# Patient Record
Sex: Male | Born: 1960 | Race: White | Hispanic: Yes | Marital: Married | Smoking: Never smoker
Health system: Southern US, Community
[De-identification: ages and names within clinical notes are randomized; demographics above are authoritative.]

## PROBLEM LIST (undated history)

## (undated) DIAGNOSIS — F319 Bipolar disorder, unspecified: Secondary | ICD-10-CM

## (undated) DIAGNOSIS — F909 Attention-deficit hyperactivity disorder, unspecified type: Secondary | ICD-10-CM

## (undated) DIAGNOSIS — L409 Psoriasis, unspecified: Secondary | ICD-10-CM

## (undated) DIAGNOSIS — E785 Hyperlipidemia, unspecified: Secondary | ICD-10-CM

## (undated) HISTORY — DX: Psoriasis, unspecified: L40.9

## (undated) HISTORY — DX: Hyperlipidemia, unspecified: E78.5

## (undated) HISTORY — PX: OTHER SURGICAL HISTORY: SHX169

## (undated) HISTORY — DX: Attention-deficit hyperactivity disorder, unspecified type: F90.9

## (undated) HISTORY — DX: Bipolar disorder, unspecified: F31.9

---

## 1998-12-01 ENCOUNTER — Inpatient Hospital Stay (HOSPITAL_COMMUNITY): Admission: EM | Admit: 1998-12-01 | Discharge: 1998-12-04 | Payer: Self-pay | Admitting: Emergency Medicine

## 2002-04-26 ENCOUNTER — Other Ambulatory Visit (HOSPITAL_COMMUNITY): Admission: RE | Admit: 2002-04-26 | Discharge: 2002-04-30 | Payer: Self-pay | Admitting: *Deleted

## 2003-11-24 ENCOUNTER — Emergency Department (HOSPITAL_COMMUNITY): Admission: EM | Admit: 2003-11-24 | Discharge: 2003-11-24 | Payer: Self-pay | Admitting: Emergency Medicine

## 2006-05-26 ENCOUNTER — Ambulatory Visit: Payer: Self-pay | Admitting: Family Medicine

## 2007-01-15 ENCOUNTER — Ambulatory Visit: Payer: Self-pay | Admitting: Family Medicine

## 2007-01-15 LAB — CONVERTED CEMR LAB
ALT: 30 units/L (ref 0–40)
AST: 30 units/L (ref 0–37)
Albumin: 4.1 g/dL (ref 3.5–5.2)
Alkaline Phosphatase: 34 units/L — ABNORMAL LOW (ref 39–117)
BUN: 16 mg/dL (ref 6–23)
Calcium: 9.4 mg/dL (ref 8.4–10.5)
Chloride: 100 meq/L (ref 96–112)
Creatinine, Ser: 1 mg/dL (ref 0.4–1.5)
GFR calc non Af Amer: 87 mL/min
Sodium: 136 meq/L (ref 135–145)
Total Bilirubin: 0.6 mg/dL (ref 0.3–1.2)
Total CHOL/HDL Ratio: 6.1
VLDL: 28 mg/dL (ref 0–40)
Valproic Acid Lvl: 61.4 ug/mL (ref 50.0–100.0)

## 2007-01-30 ENCOUNTER — Encounter: Admission: RE | Admit: 2007-01-30 | Discharge: 2007-01-30 | Payer: Self-pay | Admitting: Family Medicine

## 2007-07-24 ENCOUNTER — Ambulatory Visit: Payer: Self-pay | Admitting: Family Medicine

## 2007-07-24 DIAGNOSIS — L408 Other psoriasis: Secondary | ICD-10-CM

## 2007-07-24 DIAGNOSIS — F3162 Bipolar disorder, current episode mixed, moderate: Secondary | ICD-10-CM | POA: Insufficient documentation

## 2007-07-24 DIAGNOSIS — F909 Attention-deficit hyperactivity disorder, unspecified type: Secondary | ICD-10-CM | POA: Insufficient documentation

## 2007-07-24 DIAGNOSIS — E785 Hyperlipidemia, unspecified: Secondary | ICD-10-CM

## 2007-07-30 ENCOUNTER — Encounter (INDEPENDENT_AMBULATORY_CARE_PROVIDER_SITE_OTHER): Payer: Self-pay | Admitting: Family Medicine

## 2007-07-30 LAB — CONVERTED CEMR LAB
Cholesterol: 198 mg/dL (ref 0–200)
HDL: 31.6 mg/dL — ABNORMAL LOW (ref >39.0)
Triglycerides: 208 mg/dL (ref 0–149)
VLDL: 42 mg/dL — ABNORMAL HIGH (ref 0–40)

## 2007-10-22 ENCOUNTER — Telehealth (INDEPENDENT_AMBULATORY_CARE_PROVIDER_SITE_OTHER): Payer: Self-pay | Admitting: *Deleted

## 2007-10-23 ENCOUNTER — Ambulatory Visit: Payer: Self-pay | Admitting: Family Medicine

## 2007-10-23 ENCOUNTER — Encounter (INDEPENDENT_AMBULATORY_CARE_PROVIDER_SITE_OTHER): Payer: Self-pay | Admitting: *Deleted

## 2007-10-23 ENCOUNTER — Telehealth (INDEPENDENT_AMBULATORY_CARE_PROVIDER_SITE_OTHER): Payer: Self-pay | Admitting: Family Medicine

## 2008-04-19 ENCOUNTER — Ambulatory Visit: Payer: Self-pay | Admitting: Internal Medicine

## 2008-05-10 ENCOUNTER — Encounter (INDEPENDENT_AMBULATORY_CARE_PROVIDER_SITE_OTHER): Payer: Self-pay | Admitting: *Deleted

## 2008-05-10 ENCOUNTER — Telehealth (INDEPENDENT_AMBULATORY_CARE_PROVIDER_SITE_OTHER): Payer: Self-pay | Admitting: *Deleted

## 2008-06-08 ENCOUNTER — Ambulatory Visit: Payer: Self-pay | Admitting: *Deleted

## 2008-06-08 LAB — CONVERTED CEMR LAB
ALT: 35 units/L (ref 0–53)
Albumin: 4.1 g/dL (ref 3.5–5.2)
Basophils Absolute: 0 10*3/uL (ref 0.0–0.1)
Basophils Relative: 0 % (ref 0.0–3.0)
CO2: 30 meq/L (ref 19–32)
Calcium: 9.1 mg/dL (ref 8.4–10.5)
Chloride: 106 meq/L (ref 96–112)
Cholesterol: 190 mg/dL (ref 0–200)
GFR calc Af Amer: 104 mL/min
LDL Cholesterol: 123 mg/dL — ABNORMAL HIGH (ref 0–99)
Lymphocytes Relative: 37.1 % (ref 12.0–46.0)
MCHC: 34.6 g/dL (ref 30.0–36.0)
Monocytes Relative: 7.4 % (ref 3.0–12.0)
Neutrophils Relative %: 54.2 % (ref 43.0–77.0)
RBC: 5.47 M/uL (ref 4.22–5.81)
RDW: 11.9 % (ref 11.5–14.6)
Sodium: 142 meq/L (ref 135–145)
Total CHOL/HDL Ratio: 6.7
Total Protein: 6.9 g/dL (ref 6.0–8.3)
VLDL: 38 mg/dL (ref 0–40)

## 2009-01-16 ENCOUNTER — Ambulatory Visit: Payer: Self-pay | Admitting: *Deleted

## 2009-02-24 ENCOUNTER — Ambulatory Visit: Payer: Self-pay | Admitting: *Deleted

## 2009-02-24 ENCOUNTER — Encounter: Payer: Self-pay | Admitting: Internal Medicine

## 2009-02-24 DIAGNOSIS — B889 Infestation, unspecified: Secondary | ICD-10-CM | POA: Insufficient documentation

## 2009-02-24 LAB — CONVERTED CEMR LAB
ALT: 24 units/L (ref 0–53)
AST: 26 units/L (ref 0–37)
Albumin: 4.1 g/dL (ref 3.5–5.2)
Basophils Absolute: 0 10*3/uL (ref 0.0–0.1)
Calcium: 9.3 mg/dL (ref 8.4–10.5)
Chloride: 104 meq/L (ref 96–112)
Creatinine, Ser: 1 mg/dL (ref 0.4–1.5)
Eosinophils Absolute: 0.1 10*3/uL (ref 0.0–0.7)
Hemoglobin: 16.7 g/dL (ref 13.0–17.0)
LDL Cholesterol: 136 mg/dL — ABNORMAL HIGH (ref 0–99)
Lymphocytes Relative: 37.6 % (ref 12.0–46.0)
MCHC: 35.2 g/dL (ref 30.0–36.0)
Neutro Abs: 3.3 10*3/uL (ref 1.4–7.7)
Potassium: 4.2 meq/L (ref 3.5–5.1)
RDW: 11.9 % (ref 11.5–14.6)
Sodium: 141 meq/L (ref 135–145)
Total CHOL/HDL Ratio: 6
Total Protein: 7.1 g/dL (ref 6.0–8.3)

## 2009-02-27 ENCOUNTER — Encounter: Payer: Self-pay | Admitting: Internal Medicine

## 2009-02-28 ENCOUNTER — Encounter: Payer: Self-pay | Admitting: Internal Medicine

## 2009-03-02 ENCOUNTER — Telehealth: Payer: Self-pay | Admitting: Internal Medicine

## 2009-03-03 ENCOUNTER — Telehealth (INDEPENDENT_AMBULATORY_CARE_PROVIDER_SITE_OTHER): Payer: Self-pay | Admitting: *Deleted

## 2009-08-04 ENCOUNTER — Encounter: Payer: Self-pay | Admitting: Internal Medicine

## 2009-08-29 ENCOUNTER — Ambulatory Visit: Payer: Self-pay | Admitting: Family

## 2009-08-30 ENCOUNTER — Encounter: Payer: Self-pay | Admitting: Family

## 2009-08-30 LAB — CONVERTED CEMR LAB
AST: 24 units/L (ref 0–37)
LDL Cholesterol: 155 mg/dL — ABNORMAL HIGH (ref 0–99)
Triglycerides: 166 mg/dL — ABNORMAL HIGH (ref <150)

## 2009-11-16 ENCOUNTER — Ambulatory Visit: Payer: Self-pay | Admitting: Family Medicine

## 2009-11-16 ENCOUNTER — Telehealth: Payer: Self-pay | Admitting: Family Medicine

## 2010-02-01 ENCOUNTER — Ambulatory Visit: Payer: Self-pay | Admitting: Family Medicine

## 2010-02-02 ENCOUNTER — Encounter: Payer: Self-pay | Admitting: Family Medicine

## 2010-02-02 LAB — CONVERTED CEMR LAB
ALT: 35 units/L (ref 0–53)
AST: 31 units/L (ref 0–37)

## 2010-07-19 ENCOUNTER — Ambulatory Visit: Payer: Self-pay | Admitting: Family Medicine

## 2010-07-20 ENCOUNTER — Encounter: Payer: Self-pay | Admitting: Family Medicine

## 2010-09-14 LAB — CONVERTED CEMR LAB
ALT: 20 units/L (ref 0–53)
AST: 20 units/L (ref 0–37)
Albumin: 4.1 g/dL (ref 3.5–5.2)
Alkaline Phosphatase: 39 units/L (ref 39–117)
HDL goal, serum: 40 mg/dL
LDL Cholesterol: 151 mg/dL — ABNORMAL HIGH (ref 0–99)
LDL Goal: 130 mg/dL
MCHC: 33.1 g/dL (ref 30.0–36.0)
Platelets: 195 10*3/uL (ref 150–400)
Potassium: 4.8 meq/L (ref 3.5–5.3)
RBC: 5.44 M/uL (ref 4.22–5.81)
RDW: 13.2 % (ref 11.5–15.5)
Sodium: 141 meq/L (ref 135–145)
Total Protein: 7.1 g/dL (ref 6.0–8.3)
Valproic Acid Lvl: 74 ug/mL (ref 50.0–100.0)

## 2010-12-20 NOTE — Assessment & Plan Note (Signed)
Summary: URI/ check Cholesterol   Vital Signs:  Patient profile:   50 year old male Height:      67 inches Weight:      232 pounds BMI:     36.47 O2 Sat:      96 % on Room air Temp:     98.6 degrees F oral Pulse rate:   67 / minute BP sitting:   131 / 81  (left arm) Cuff size:   large  Vitals Entered By: Payton Spark CMA (February 01, 2010 11:56 AM)  O2 Flow:  Room air CC: Cold and cough. F/U cholesterol.   Primary Care Provider:  Seymour Bars DO  CC:  Cold and cough. F/U cholesterol.Marland Kitchen  History of Present Illness: 50 yo HM presents for 4 days of cold symptoms.  His son has been sick.  He has cough and wheezing.  He is starting to feel better.  Using OTc cough meds and some vitamin C.  He had some laryngitis.  Has a lot of rhinorrhea.  Cough kept him up at night.  no fevers or chills or GI upset.  He is not a smoker.  No hx of asthma.    Denies CP or DOE.    Current Medications (verified): 1)  Enbrel Sureclick 50 Mg/ml  Soln (Etanercept) .... Take One Inection Weekly 2)  Depakote 500 Mg  Tbec (Divalproex Sodium) .... Take Two Tablets in The Morning and 3 Tablets in The Evening. 3)  Abilify 30 Mg  Tabs (Aripiprazole) .... Take One Tablet Nightly 4)  Simvastatin 80 Mg Tabs (Simvastatin) .... 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 Occupation: works as a Engineer, technical sales Married 3 children Never Smoked Alcohol use-no  Review of Systems      See HPI  Physical Exam  General:  alert, well-developed, well-nourished, well-hydrated, and overweight-appearing.   Head:  normocephalic and atraumatic.  sinuses NTTP Eyes:  conjunctiva clear Ears:  EACs patent; TMs translucent and gray with good cone of  light and bony landmarks.  Nose:  nasal congestion/ clear rhinorrhea Mouth:  o/p injected, pink and moist. no exudates or vesicles Neck:  no masses.   Lungs:  Normal respiratory effort, chest expands symmetrically. Lungs are clear to auscultation, no crackles or wheezes. Heart:  Normal rate and regular rhythm. S1 and S2 normal without gallop, murmur, click, rub or other extra sounds. Skin:  color normal.   Cervical Nodes:  No lymphadenopathy noted   Impression & Recommendations:  Problem # 1:  VIRAL URI (ICD-465.9)  His updated medication list for this problem includes:    Hydromet 5-1.5 Mg/68ml Syrp (Hydrocodone-homatropine) .Marland KitchenMarland KitchenMarland KitchenMarland Kitchen 5 ml by mouth at bedtime as needed cough  Instructed on symptomatic treatment. Call if symptoms persist or worsen.   Problem # 2:  HYPERLIPIDEMIA (ICD-272.4)  His updated medication list for this problem includes:    Simvastatin 80 Mg Tabs (Simvastatin) ..... One tab daily at bedtime  Orders: T-Lipoprotein (LDL cholesterol)  (45409-81191) T-ALT/SGPT (47829-56213) T-AST/SGOT (08657-84696)  Labs Reviewed: SGOT: 24 (08/30/2009)   SGPT: 26 (08/30/2009)   HDL:34 (08/30/2009), 29.40 (02/24/2009)  LDL:155 (08/30/2009), 136 (02/24/2009)  Chol:222 (08/30/2009), 189 (02/24/2009)  Trig:166 (08/30/2009), 118.0 (02/24/2009)  Complete Medication List: 1)  Enbrel Sureclick 50 Mg/ml Soln (Etanercept) .... Take one inection weekly 2)  Depakote 500 Mg Tbec (Divalproex sodium) .... Take two tablets in the morning and 3 tablets in the evening. 3)  Abilify 30 Mg Tabs (Aripiprazole) .... Take one tablet nightly 4)  Simvastatin 80 Mg Tabs (Simvastatin) .... One tab daily at bedtime 5)  Hydromet 5-1.5 Mg/73ml Syrp (Hydrocodone-homatropine) .... 5 ml by mouth at bedtime as needed cough Prescriptions: HYDROMET 5-1.5 MG/5ML SYRP (HYDROCODONE-HOMATROPINE) 5 ml by mouth at bedtime as needed cough  #100 ml x 0   Entered and Authorized by:   Seymour Bars DO   Signed by:   Seymour Bars DO on 02/01/2010   Method used:   Printed then faxed to ...       [facility] [address] [phone]* (retail)       [address]       [place], [place]  [address]       Ph: [phone]       Fax: [phone]   RxID:   [id number]

## 2010-12-20 NOTE — Assessment & Plan Note (Signed)
Summary: high cholesterol   Vital Signs:  Patient profile:   50 year old male Height:      67 inches Weight:      231 pounds BMI:     36.31 O2 Sat:      96 % on Room air Pulse rate:   74 / minute BP sitting:   128 / 76  (left arm) Cuff size:   large  Vitals Entered By: Payton Spark CMA (July 19, 2010 11:13 AM)  O2 Flow:  Room air CC: F/U. Fasting labs.   Primary Care Provider:  Seymour Bars DO  CC:  F/U. Fasting labs..  History of Present Illness: Mr Trevor Pearson is a 50 year-old male with a history of Bipolar disorder, hyperlipidemia, and Psorsias.  Today he says he is doing well and taking all his medications.  He does complain of blurry vision while reading and plans to get an eye exam soon to get glasses.    He denies any recent weight gain and is attempting to lose weight currently.  He is walking more at work and has gotten a Photographer.  He also says his wife does most of the cooking and he is eating much better at home and avoid fast food.  He denies, abdominal pain, diarrhea, constipation, muscle pain and aches, jaundice or fevers.  He complains of a recent flare of his psorsias and that he will be seeing his dermatologist soon to increase the frequency of his shots.  He also denies any Recent symptoms of mania.  Current Medications (verified): 1)  Enbrel Sureclick 50 Mg/ml  Soln (Etanercept) .... Take One Inection Weekly 2)  Depakote 500 Mg  Tbec (Divalproex Sodium) .... Take Two Tablets in The Morning and 3 Tablets in The Evening. 3)  Abilify 30 Mg  Tabs (Aripiprazole) .... Take One Tablet Nightly 4)  Simvastatin 80 Mg Tabs (Simvastatin) .... One Tab Daily At Bedtime  Allergies (verified): No Known Drug Allergies  Past History:  Past Medical History: Reviewed history from 02/01/2010 and no changes required. 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 Biopsy of liver 1997 - WNL no other surgical history  Social History: Reviewed history from 01/16/2009 and no changes required. 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   Head:  normocephalic and atraumatic.   Mouth:  good dentition and no lesions.   Neck:  supple and no thyromegaly.   Lungs:  Clear to auscultation bilaterally with no rales, rhonchi, wheezes or crackles. Heart:  normal rate, regular rhythm, no murmur, no gallop, and no rub.   Extremities:  no LE edema Skin:  Diffuse psorias over the face, ears chest and abdomen.  There is peeling around the eyes and hairline on the head. Cervical Nodes:  No lymphadenopathy noted Psych:  normally interactive, good eye contact, not anxious appearing, and not agitated.     Impression & Recommendations:  Problem # 1:  HYPERLIPIDEMIA (ICD-272.4) He is due to recheck labs.  F/U results tomorrow and adjust accordigly. His updated medication list for this problem includes:    Simvastatin 80 Mg Tabs (Simvastatin) ..... One tab daily at bedtime  Orders: T-Lipid Profile ([id number])  Problem # 2:  PSORIASIS (ICD-696.1) Flaring up on current meds.  Has appt with derm in the next wk. 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  Seeing psych.  Continue current meds. Orders: T-Valproic Acid (Depakene) ([id number])  Complete Medication List: 1)  Enbrel Sureclick 50 Mg/ml Soln (Etanercept) .... Take one inection weekly 2)  Depakote 500 Mg Tbec (Divalproex sodium) .... Take two tablets in the morning and 3 tablets in the evening. 3)  Abilify 30 Mg Tabs (Aripiprazole) .... Take one tablet nightly 4)  Simvastatin 80 Mg Tabs (Simvastatin) .... One tab daily at  bedtime  Other Orders: T-Comprehensive Metabolic Panel ([id number])  Patient Instructions: 1)  Labs today. 2)  Will call you w/ results tomorrow. 3)  Return for a PHYSICAL in 6 mos. Prescriptions: SIMVASTATIN 80 MG TABS (SIMVASTATIN) One tab daily at bedtime  #30 x 6   Entered and Authorized by:   Seymour Bars DO   Signed by:   Seymour Bars DO on 07/19/2010   Method used:   Electronically to        [facility]. [phone]* (retail)       [address]       [place], [place]  [address]       Ph: [phone]       Fax: [phone]   RxID:   [id number]

## 2011-01-18 ENCOUNTER — Ambulatory Visit: Payer: Self-pay | Admitting: Family Medicine

## 2011-02-19 ENCOUNTER — Other Ambulatory Visit: Payer: Self-pay | Admitting: Family Medicine

## 2011-03-27 ENCOUNTER — Other Ambulatory Visit: Payer: Self-pay | Admitting: Family Medicine

## 2011-04-05 NOTE — Assessment & Plan Note (Signed)
[facility]                        [place] OFFICE NOTE   NAME:Trevor Pearson, Trevor Pearson                        MRN:          [id number]  DATE:01/15/2007                            DOB:          Apr 17, 1961    REASON FOR VISIT:  Shortness of breath.   Mr. Trevor Pearson is a 50 year old male with a history of bipolar disorder,  ADHD, dyslexia and severe psoriasis. He also has a history of  hyperlipidemia. The patient reports that over the last 2 to 3 weeks he  has noticed dyspnea on exertion. The patient states that it usually  occurs when he first exerts himself heavily or if he talks too fast.  The patient does state that he is very deconditioned and is planning to  start an exercise regimen. He denies any chest pain, palpitations,  nausea or vomiting.   The patient also reports that he has trouble with reading due to blurry  vision. He has already seen an ophthalmologist who recommended he buy  over-the-counter magnifying eye glasses.   MEDICATIONS:  1. Depakote 500 mg two in the morning and three in the afternoon.  2. Abilify 30 mg daily.  3. Zocor 40 mg daily.  4. Xopenex p.r.n.   ALLERGIES:  No known drug allergies.   PHYSICAL EXAMINATION:  Weight is 206, temperature is 98.3, pulse 80,  blood pressure 138/94.  GENERAL: Pleasant male in no acute distress. Answers questions  appropriately. Alert and oriented x3.  NECK: Supple with no lymphadenopathy, carotid bruits, or  JVD. No  thyromegaly.  LUNGS:  Clear.  HEART: Regular rate and rhythm, normal S1, S2. No murmurs, gallops or  rubs.  EXTREMITIES: No cyanosis, clubbing or edema.   EKG was significant for normal sinus rhythm with no ST elevation or  depression. No Q-wave, PVC or PACs noted or LVH or RVH appreciated.   IMPRESSION:  A 50 year old male with a history of hyperlipidemia who  presents with a 2 to 3 week history of dyspnea on exertion. The patient  is deconditioned and is trying to  start an exercise regimen.   PLAN:  1. In regards to his dyspnea, need to rule out cardiac etiology before      he starts exercising and thus will be referred to Cardiology.  2. Will also obtain a chest x-ray.  3. The patient's recent LDL was 207 and he was started on Zocor 40 mg      and is to have a recheck lipid profile today including an AST and      ALT.  4. This patient has not seen his dermatologist in over a year. I will      refill his Dovonex until he schedules his followup.  5. Will have the patient followup in two weeks or sooner if need be.      The patient expressed understanding.     Leanne Chang, M.D.  Electronically Signed    [facility]  DD: 01/15/2007  DT: 01/15/2007  Job #: [id number]

## 2011-05-17 ENCOUNTER — Other Ambulatory Visit: Payer: Self-pay | Admitting: Family Medicine

## 2011-06-13 ENCOUNTER — Other Ambulatory Visit: Payer: Self-pay | Admitting: Family Medicine

## 2011-06-26 ENCOUNTER — Encounter: Payer: Self-pay | Admitting: Family Medicine

## 2011-06-27 ENCOUNTER — Telehealth: Payer: Self-pay | Admitting: *Deleted

## 2011-06-27 ENCOUNTER — Encounter: Payer: Medicare Other | Admitting: Family Medicine

## 2011-06-27 DIAGNOSIS — Z1329 Encounter for screening for other suspected endocrine disorder: Secondary | ICD-10-CM

## 2011-06-27 DIAGNOSIS — E785 Hyperlipidemia, unspecified: Secondary | ICD-10-CM

## 2011-06-27 DIAGNOSIS — L409 Psoriasis, unspecified: Secondary | ICD-10-CM

## 2011-06-27 DIAGNOSIS — Z130 Encounter for screening for diseases of the blood and blood-forming organs and certain disorders involving the immune mechanism: Secondary | ICD-10-CM

## 2011-06-27 NOTE — Telephone Encounter (Signed)
Lab order printed

## 2011-06-27 NOTE — Telephone Encounter (Signed)
Pt had to reschedule CPE. He would like to still have labs today bc he is fasting. Please order and I will fax. Pt also requests CRP added to labs, it is needed for weight loss program he wants to try.

## 2011-06-28 LAB — LIPID PANEL
Cholesterol: 174 mg/dL (ref 0–200)
VLDL: 31 mg/dL (ref 0–40)

## 2011-06-28 LAB — COMPLETE METABOLIC PANEL WITH GFR
ALT: 27 U/L (ref 0–53)
AST: 23 U/L (ref 0–37)
Albumin: 4.4 g/dL (ref 3.5–5.2)
Alkaline Phosphatase: 29 U/L — ABNORMAL LOW (ref 39–117)
BUN: 17 mg/dL (ref 6–23)
Calcium: 9.4 mg/dL (ref 8.4–10.5)
Chloride: 104 mEq/L (ref 96–112)
Potassium: 4.1 mEq/L (ref 3.5–5.3)
Sodium: 142 mEq/L (ref 135–145)
Total Protein: 6.5 g/dL (ref 6.0–8.3)

## 2011-06-28 LAB — CBC WITH DIFFERENTIAL/PLATELET
Basophils Absolute: 0 10*3/uL (ref 0.0–0.1)
Basophils Relative: 0 % (ref 0–1)
Eosinophils Absolute: 0 10*3/uL (ref 0.0–0.7)
HCT: 48.2 % (ref 39.0–52.0)
MCH: 31 pg (ref 26.0–34.0)
MCHC: 33.8 g/dL (ref 30.0–36.0)
Monocytes Absolute: 0.3 10*3/uL (ref 0.1–1.0)
Neutro Abs: 2.1 10*3/uL (ref 1.7–7.7)
Neutrophils Relative %: 45 % (ref 43–77)
RDW: 13.1 % (ref 11.5–15.5)

## 2011-07-01 ENCOUNTER — Telehealth: Payer: Self-pay | Admitting: Family Medicine

## 2011-07-01 NOTE — Telephone Encounter (Signed)
Pls let pt know that his blood counts and CRP came back normal.  Fax copy to his rhuematologist.   His chemistry panel shows a fasting sugar of 102, just barely into the pre diabetic range with normal liver and kidney function.  His cholesterol is improved on Crestor with a total from 222--> 174, TGs from 260--> 153 and an LDL from 151--> 112.  Continue crestor.

## 2011-07-10 ENCOUNTER — Encounter: Payer: Self-pay | Admitting: Family Medicine

## 2011-07-17 NOTE — Telephone Encounter (Signed)
Called the pt regarding his lab results, but the pt was not available.  LMOM telling the pt that his blood counts and CRP came back normal.  Will fax to rheumatologist, and pt instructed to call with the rheumatologist # since no referrals are seen in new or old system letting the office know who the rheumatologist is. Jarvis Newcomer, LPN [facility]

## 2011-07-18 ENCOUNTER — Encounter: Payer: Self-pay | Admitting: Family Medicine

## 2011-07-30 ENCOUNTER — Ambulatory Visit (INDEPENDENT_AMBULATORY_CARE_PROVIDER_SITE_OTHER): Payer: Medicare Other | Admitting: Family Medicine

## 2011-07-30 ENCOUNTER — Encounter: Payer: Self-pay | Admitting: Family Medicine

## 2011-07-30 VITALS — BP 136/84 | HR 76 | Ht 67.0 in | Wt 228.0 lb

## 2011-07-30 DIAGNOSIS — F319 Bipolar disorder, unspecified: Secondary | ICD-10-CM

## 2011-07-30 DIAGNOSIS — L408 Other psoriasis: Secondary | ICD-10-CM

## 2011-07-30 DIAGNOSIS — Z0000 Encounter for general adult medical examination without abnormal findings: Secondary | ICD-10-CM

## 2011-07-30 DIAGNOSIS — E669 Obesity, unspecified: Secondary | ICD-10-CM

## 2011-07-30 DIAGNOSIS — E785 Hyperlipidemia, unspecified: Secondary | ICD-10-CM

## 2011-07-30 DIAGNOSIS — L409 Psoriasis, unspecified: Secondary | ICD-10-CM

## 2011-07-30 LAB — POCT GLYCOSYLATED HEMOGLOBIN (HGB A1C): Hemoglobin A1C: 5.7

## 2011-07-30 MED ORDER — DIVALPROEX SODIUM 500 MG PO DR TAB: DELAYED_RELEASE_TABLET | ORAL | Status: DC

## 2011-07-30 MED ORDER — ROSUVASTATIN CALCIUM 20 MG PO TABS: 20.0000 mg | ORAL_TABLET | Freq: Every day | ORAL | Status: DC

## 2011-07-30 MED ORDER — ARIPIPRAZOLE 30 MG PO TABS: 30.0000 mg | ORAL_TABLET | Freq: Every day | ORAL | Status: DC

## 2011-07-30 NOTE — Telephone Encounter (Signed)
LMOM for the pt asking him to call the triage nurse to give rheumatologist information so we can get the most recent labs to that provider as our Doctor had ordered. Trevor Newcomer, LPN [facility]

## 2011-07-30 NOTE — Progress Notes (Addendum)
  Subjective:    Patient ID: Trevor Pearson, male    DOB: [birth year], 50 y.o.   MRN: [id number]  HPI Patient is a 50 year old HM who is here for a yearly exam. Hx of hyperlipidemia,poriasis and bipolar DX.Last tetnus was in 2005.   Review of Systems  Constitutional: Negative.   HENT: Negative.   Eyes: Negative.   Respiratory: Negative.   Cardiovascular: Negative.   Gastrointestinal: Negative.   Genitourinary: Negative.   Musculoskeletal: Negative.   Neurological: Negative for dizziness.  Psychiatric/Behavioral:       Reports Bipolar symptoms under control       Objective:   Physical Exam  Constitutional: He is oriented to person, place, and time. He appears well-developed and well-nourished.  HENT:  Head: Normocephalic and atraumatic.  Eyes: EOM are normal. Pupils are equal, round, and reactive to light.  Neck: Normal range of motion. Neck supple.  Cardiovascular: Normal rate, regular rhythm and normal heart sounds.   Pulmonary/Chest: Effort normal and breath sounds normal.  Abdominal: Soft.  Genitourinary: Rectum normal, prostate normal and penis normal. Guaiac negative stool.  Musculoskeletal: Normal range of motion. He exhibits no edema.  Neurological: He is alert and oriented to person, place, and time. He has normal reflexes.  Skin: Skin is warm and dry. Rash noted.       Psoriatic rash present  Psychiatric: He has a normal mood and affect. His behavior is normal.    Component     Latest Ref Rng 07/30/2011  Hemoglobin A1C      5.7        Assessment & Plan:  Return ibn 4 months for follow up  Depending on weight  Loss may reduce Crestor dosage but doubtful he will be taken off for good.

## 2011-08-09 ENCOUNTER — Other Ambulatory Visit: Payer: Self-pay | Admitting: *Deleted

## 2011-08-09 MED ORDER — ROSUVASTATIN CALCIUM 20 MG PO TABS: 20.0000 mg | ORAL_TABLET | Freq: Every day | ORAL | Status: DC

## 2011-08-19 NOTE — Telephone Encounter (Signed)
Left final message after 3 calls for pt instructing him to call the office to give name of rheumatologist in order that we may call to get that information. Jarvis Newcomer, LPN [facility]

## 2011-12-03 ENCOUNTER — Encounter: Payer: Self-pay | Admitting: Family Medicine

## 2011-12-03 ENCOUNTER — Ambulatory Visit (INDEPENDENT_AMBULATORY_CARE_PROVIDER_SITE_OTHER): Payer: Medicare Other | Admitting: Family Medicine

## 2011-12-03 ENCOUNTER — Ambulatory Visit: Payer: Medicare Other | Admitting: Family Medicine

## 2011-12-03 DIAGNOSIS — L408 Other psoriasis: Secondary | ICD-10-CM

## 2011-12-03 DIAGNOSIS — L308 Other specified dermatitis: Secondary | ICD-10-CM

## 2011-12-03 DIAGNOSIS — Z23 Encounter for immunization: Secondary | ICD-10-CM

## 2011-12-03 DIAGNOSIS — E785 Hyperlipidemia, unspecified: Secondary | ICD-10-CM

## 2011-12-03 MED ORDER — TETANUS-DIPHTH-ACELL PERTUSSIS 5-2.5-18.5 LF-MCG/0.5 IM SUSP: 0.5000 mL | Freq: Once | INTRAMUSCULAR | Status: DC

## 2011-12-03 NOTE — Progress Notes (Signed)
Subjective:     Patient ID: Trevor Pearson, male   DOB: Dec 29, 1960, 51 y.o.   MRN: [id number]  Hyperlipidemia This is a chronic problem. The current episode started more than 1 year ago. The problem is uncontrolled. Recent lipid tests were reviewed and are variable. Exacerbating diseases include liver disease. He has no history of chronic renal disease, diabetes, hypothyroidism or obesity. Factors aggravating his hyperlipidemia include no known factors. Current antihyperlipidemic treatment includes statins. Compliance problems include adherence to diet.  Risk factors for coronary artery disease include dyslipidemia and male sex.   Wants follow up on cholestrl Patient here for follow up from PE.  Review of Systems  Skin:       Psoriasis flare up on immunosupressant  All other systems reviewed and are negative.   BP 130/80  Pulse 65  Ht 5\' 7"  (1.702 m)  Wt 222 lb (100.699 kg)  BMI 34.77 kg/m2  SpO2 99%    Objective:   Physical Exam  Constitutional: He is oriented to person, place, and time. He appears well-developed and well-nourished.       Hispanic male  HENT:  Head: Normocephalic.  Neck: Normal range of motion. Neck supple.  Cardiovascular: Normal rate and regular rhythm.  Exam reveals no gallop.   No murmur heard. Pulmonary/Chest: Effort normal and breath sounds normal. No respiratory distress. He has no wheezes.  Neurological: He is alert and oriented to person, place, and time.  Skin:       psoriac rashes scattered       Assessment:     Hyperlipidemia psoriasis    Plan:     Lipid panel continue the crestor Psoriasis continue the enbrel  Return in 6 months

## 2011-12-03 NOTE — Patient Instructions (Signed)
Tetanus and Diphtheria Vaccine Your caregiver has suggested that you receive an immunization to prevent tetanus (lockjaw) and diphtheria. Tetanus and diphtheria are serious and deadly infectious diseases of the past that have been nearly wiped out by modern immunizations. Td or DT vaccines (shots) are the immunizations given to help prevent these illnesses. Td is the medical term for a standard tetanus dose, small diphtheria dose. DT means both in standard doses. ABOUT THE DISEASES Tetanus (lockjaw) and diphtheria are serious diseases. Tetanus is caused by a germ that lives in the soil. It enters the body through a cut or wound, often caused by a nail or broken piece of glass. You cannot catch tetanus from another person. Diphtheria spreads when germs pass from an infected person to the nose or throat of others. Tetanus causes serious, painful spasms of all muscles. It can lead to:  "Locking" of the muscles of the jaw and throat, so the patient cannot open his or her mouth or swallow.   Damage to the heart muscle.  Diphtheria causes a thick coating in the nose, throat, or airway. It can lead to:  Breathing problems.   Kidney problems.   Heart failure.   Paralysis.   Death.  ABOUT THE VACCINES  A vaccine is a shot (immunization) that can help prevent a disease. Vaccines have helped lower the rates of getting certain diseases. If people stopped getting vaccinated, more people would develop illnesses. These vaccines can be used in three ways:  As catch-up for people who did not get all their doses when they were children.   As a booster dose every 10 years.   For protection against tetanus infection, after a wound.  Benefits of the vaccines Vaccination is the best way to protect against tetanus and diphtheria. Because of vaccination, there are fewer cases of these diseases. Cases are rare in children because most get a routine vaccination with DTP (Diphtheria, Tetanus, and Pertussis), DTaP  (Diphtheria, Tetanus, and acellular Pertussis), or DT (Diphtheria and Tetanus) vaccines. There would be many more cases if we stopped vaccinating people. Tetanus kills about 1 in 5 people who are infected. WHEN SHOULD YOU GET TD VACCINE?  Td is made for people 7 years of age and older.   People who have not gotten at least 3 doses of any tetanus and diphtheria vaccine (DTP, DTaP or DT) during their lifetime should do so using Td. After a person gets the third dose, a Td dose is needed every 10 years all through life. This is because protection fades over time. Booster shots are needed every 10 years.   Other vaccines may be given at the same time as Td.  You may not know today whether your immunizations are current. The vaccine given today is to protect you from your next cut or injury. It does not offer protection for the current injury. An immune globulin injection may be given, if protection is needed immediately. Check with your caregiver later regarding your immunization status. Tell your caregiver if the person getting the vaccine:  Has ever had a serious allergic reaction or other problem with Td, or any other tetanus and diphtheria vaccine (DTP, DTaP, or DT). People who have had a serious allergic reaction should not receive the vaccine.   Has epilepsy or another nervous system illness.   Has had Guillain Barre Syndrome (GBS) in the past.   Now has a moderate or severe illness.   Is pregnant.   If you are not sure, ask   your caregiver.  WHAT ARE THE RISKS FROM TD VACCINE?  As with any medicine, there are very small risks that serious problems, even death, could occur after getting a vaccine. However, the risk of a serious side effect from the vaccine is almost zero.   The risks from the vaccine are much smaller than the risks from the diseases, if people stopped getting vaccinated. Both diseases can cause serious health problems, which are prevented by the vaccine.   Almost all  people who get Td have no problems from it.  Mild problems If mild problems occur, they usually start within hours to a day or two after vaccination. They may last 1-2 days:  Soreness, redness, or swelling where the shot was given.   Headache or tiredness.   Occasionally, a low grade fever.  These problems can be worse in adults who get Td vaccine very often. Non-aspirin medicines may be used to reduce soreness. Severe problems These problems happen very rarely:  Serious allergic reaction (at most, occurs in 1 in 1 million vaccinated persons). This occurs almost immediately, and is treatable with medicines. Signs of a serious allergic reaction include:   Difficulty breathing.   Hoarseness or wheezing.   Hives.   Dizziness.   Deep, aching pain and muscle wasting in upper arm(s).  Overall, the benefits to you and your family from these vaccines are far greater than the risk. WHAT TO DO IF THERE IS A SERIOUS REACTION:  Call a caregiver or get the person to a doctor or emergency room right away.   Write down what happened, the date and time it happened, and tell your caregiver.   Ask your caregiver to file a Vaccine Adverse Event Report form or call, toll-free: [phone]  If you want to learn more about this vaccine, ask your caregiver. She/he can give you the vaccine package insert or suggest other sources of information. Also, the [employer] gives compensation (payment) for persons thought to be injured by vaccines. For details call, toll-free: [phone]. Document Released: 11/01/2000 Document Revised: 07/17/2011 Document Reviewed: 09/21/2009 [facility] Patient Information 2012 [place], [place].Hypertriglyceridemia  Diet for High blood levels of Triglycerides Most fats in food are triglycerides. Triglycerides in your blood are stored as fat in your body. High levels of triglycerides in your blood may put you at a greater risk for heart  disease and stroke.  Normal triglyceride levels are less than 150 mg/dL. Borderline high levels are 150-199 mg/dl. High levels are 200 - 499 mg/dL, and very high triglyceride levels are greater than 500 mg/dL. The decision to treat high triglycerides is generally based on the level. For people with borderline or high triglyceride levels, treatment includes weight loss and exercise. Drugs are recommended for people with very high triglyceride levels. Many people who need treatment for high triglyceride levels have metabolic syndrome. This syndrome is a collection of disorders that often include: insulin resistance, high blood pressure, blood clotting problems, high cholesterol and triglycerides. TESTING PROCEDURE FOR TRIGLYCERIDES  You should not eat 4 hours before getting your triglycerides measured. The normal range of triglycerides is between 10 and 250 milligrams per deciliter (mg/dl). Some people may have extreme levels (1000 or above), but your triglyceride level may be too high if it is above 150 mg/dl, depending on what other risk factors you have for heart disease.   People with high blood triglycerides may also have high blood cholesterol levels. If you have high blood cholesterol as well  as high blood triglycerides, your risk for heart disease is probably greater than if you only had high triglycerides. High blood cholesterol is one of the main risk factors for heart disease.  CHANGING YOUR DIET  Your weight can affect your blood triglyceride level. If you are more than 20% above your ideal body weight, you may be able to lower your blood triglycerides by losing weight. Eating less and exercising regularly is the best way to combat this. Fat provides more calories than any other food. The best way to lose weight is to eat less fat. Only 30% of your total calories should come from fat. Less than 7% of your diet should come from saturated fat. A diet low in fat and saturated fat is the same as a  diet to decrease blood cholesterol. By eating a diet lower in fat, you may lose weight, lower your blood cholesterol, and lower your blood triglyceride level.  Eating a diet low in fat, especially saturated fat, may also help you lower your blood triglyceride level. Ask your dietitian to help you figure how much fat you can eat based on the number of calories your caregiver has prescribed for you.  Exercise, in addition to helping with weight loss may also help lower triglyceride levels.   Alcohol can increase blood triglycerides. You may need to stop drinking alcoholic beverages.   Too much carbohydrate in your diet may also increase your blood triglycerides. Some complex carbohydrates are necessary in your diet. These may include bread, rice, potatoes, other starchy vegetables and cereals.   Reduce "simple" carbohydrates. These may include pure sugars, candy, honey, and jelly without losing other nutrients. If you have the kind of high blood triglycerides that is affected by the amount of carbohydrates in your diet, you will need to eat less sugar and less high-sugar foods. Your caregiver can help you with this.   Adding 2-4 grams of fish oil (EPA+ DHA) may also help lower triglycerides. Speak with your caregiver before adding any supplements to your regimen.  Following the Diet  Maintain your ideal weight. Your caregivers can help you with a diet. Generally, eating less food and getting more exercise will help you lose weight. Joining a weight control group may also help. Ask your caregivers for a good weight control group in your area.  Eat low-fat foods instead of high-fat foods. This can help you lose weight too.  These foods are lower in fat. Eat MORE of these:   Dried beans, peas, and lentils.   Egg whites.   Low-fat cottage cheese.   Fish.   Lean cuts of meat, such as round, sirloin, rump, and flank (cut extra fat off meat you fix).   Whole grain breads, cereals and pasta.   Skim  and nonfat dry milk.   Low-fat yogurt.   Poultry without the skin.   Cheese made with skim or part-skim milk, such as mozzarella, parmesan, farmers', ricotta, or pot cheese.  These are higher fat foods. Eat LESS of these:   Whole milk and foods made from whole milk, such as American, blue, cheddar, monterey jack, and swiss cheese   High-fat meats, such as luncheon meats, sausages, knockwurst, bratwurst, hot dogs, ribs, corned beef, ground pork, and regular ground beef.   Fried foods.  Limit saturated fats in your diet. Substituting unsaturated fat for saturated fat may decrease your blood triglyceride level. You will need to read package labels to know which products contain saturated fats.  These foods  are high in saturated fat. Eat LESS of these:   Fried pork skins.   Whole milk.   Skin and fat from poultry.   Palm oil.   Butter.   Shortening.   Cream cheese.   Tomasa Blase.   Margarines and baked goods made from listed oils.   Vegetable shortenings.   Chitterlings.   Fat from meats.   Coconut oil.   Palm kernel oil.   Lard.   Cream.   Sour cream.   Fatback.   Coffee whiteners and non-dairy creamers made with these oils.   Cheese made from whole milk.  Use unsaturated fats (both polyunsaturated and monounsaturated) moderately. Remember, even though unsaturated fats are better than saturated fats; you still want a diet low in total fat.  These foods are high in unsaturated fat:   Canola oil.   Sunflower oil.   Mayonnaise.   Almonds.   Peanuts.   Pine nuts.   Margarines made with these oils.   Safflower oil.   Olive oil.   Avocados.   Cashews.   Peanut butter.   Sunflower seeds.   Soybean oil.   Peanut oil.   Olives.   Pecans.   Walnuts.   Pumpkin seeds.  Avoid sugar and other high-sugar foods. This will decrease carbohydrates without decreasing other nutrients. Sugar in your food goes rapidly to your blood. When there is excess  sugar in your blood, your liver may use it to make more triglycerides. Sugar also contains calories without other important nutrients.  Eat LESS of these:   Sugar, brown sugar, powdered sugar, jam, jelly, preserves, honey, syrup, molasses, pies, candy, cakes, cookies, frosting, pastries, colas, soft drinks, punches, fruit drinks, and regular gelatin.   Avoid alcohol. Alcohol, even more than sugar, may increase blood triglycerides. In addition, alcohol is high in calories and low in nutrients. Ask for sparkling water, or a diet soft drink instead of an alcoholic beverage.  Suggestions for planning and preparing meals   Bake, broil, grill or roast meats instead of frying.   Remove fat from meats and skin from poultry before cooking.   Add spices, herbs, lemon juice or vinegar to vegetables instead of salt, rich sauces or gravies.   Use a non-stick skillet without fat or use no-stick sprays.   Cool and refrigerate stews and broth. Then remove the hardened fat floating on the surface before serving.   Refrigerate meat drippings and skim off fat to make low-fat gravies.   Serve more fish.   Use less butter, margarine and other high-fat spreads on bread or vegetables.   Use skim or reconstituted non-fat dry milk for cooking.   Cook with low-fat cheeses.   Substitute low-fat yogurt or cottage cheese for all or part of the sour cream in recipes for sauces, dips or congealed salads.   Use half yogurt/half mayonnaise in salad recipes.   Substitute evaporated skim milk for cream. Evaporated skim milk or reconstituted non-fat dry milk can be whipped and substituted for whipped cream in certain recipes.   Choose fresh fruits for dessert instead of high-fat foods such as pies or cakes. Fruits are naturally low in fat.  When Dining Out   Order low-fat appetizers such as fruit or vegetable juice, pasta with vegetables or tomato sauce.   Select clear, rather than cream soups.   Ask that  dressings and gravies be served on the side. Then use less of them.   Order foods that are baked, broiled, poached, steamed, stir-fried,  or roasted.   Ask for margarine instead of butter, and use only a small amount.   Drink sparkling water, unsweetened tea or coffee, or diet soft drinks instead of alcohol or other sweet beverages.  QUESTIONS AND ANSWERS ABOUT OTHER FATS IN THE BLOOD: SATURATED FAT, TRANS FAT, AND CHOLESTEROL What is trans fat? Trans fat is a type of fat that is formed when vegetable oil is hardened through a process called hydrogenation. This process helps makes foods more solid, gives them shape, and prolongs their shelf life. Trans fats are also called hydrogenated or partially hydrogenated oils.  What do saturated fat, trans fat, and cholesterol in foods have to do with heart disease? Saturated fat, trans fat, and cholesterol in the diet all raise the level of LDL "bad" cholesterol in the blood. The higher the LDL cholesterol, the greater the risk for coronary heart disease (CHD). Saturated fat and trans fat raise LDL similarly.  What foods contain saturated fat, trans fat, and cholesterol? High amounts of saturated fat are found in animal products, such as fatty cuts of meat, chicken skin, and full-fat dairy products like butter, whole milk, cream, and cheese, and in tropical vegetable oils such as palm, palm kernel, and coconut oil. Trans fat is found in some of the same foods as saturated fat, such as vegetable shortening, some margarines (especially hard or stick margarine), crackers, cookies, baked goods, fried foods, salad dressings, and other processed foods made with partially hydrogenated vegetable oils. Small amounts of trans fat also occur naturally in some animal products, such as milk products, beef, and lamb. Foods high in cholesterol include liver, other organ meats, egg yolks, shrimp, and full-fat dairy products. How can I use the new food label to make  heart-healthy food choices? Check the Nutrition Facts panel of the food label. Choose foods lower in saturated fat, trans fat, and cholesterol. For saturated fat and cholesterol, you can also use the Percent Daily Value (%DV): 5% DV or less is low, and 20% DV or more is high. (There is no %DV for trans fat.) Use the Nutrition Facts panel to choose foods low in saturated fat and cholesterol, and if the trans fat is not listed, read the ingredients and limit products that list shortening or hydrogenated or partially hydrogenated vegetable oil, which tend to be high in trans fat. POINTS TO REMEMBER: YOU NEED A LITTLE TLC (THERAPEUTIC LIFESTYLE CHANGES)  Discuss your risk for heart disease with your caregivers, and take steps to reduce risk factors.   Change your diet. Choose foods that are low in saturated fat, trans fat, and cholesterol.   Add exercise to your daily routine if it is not already being done. Participate in physical activity of moderate intensity, like brisk walking, for at least 30 minutes on most, and preferably all days of the week. No time? Break the 30 minutes into three, 10-minute segments during the day.   Stop smoking. If you do smoke, contact your caregiver to discuss ways in which they can help you quit.   Do not use street drugs.   Maintain a normal weight.   Maintain a healthy blood pressure.   Keep up with your blood work for checking the fats in your blood as directed by your caregiver.  Document Released: 08/22/2004 Document Revised: 07/17/2011 Document Reviewed: 03/20/2009 [facility] Patient Information 2012 [place], [place].

## 2011-12-10 LAB — LIPID PANEL
HDL: 31 mg/dL — ABNORMAL LOW (ref >39)
LDL Cholesterol: 107 mg/dL — ABNORMAL HIGH (ref 0–99)
Total CHOL/HDL Ratio: 5.4 Ratio
VLDL: 28 mg/dL (ref 0–40)

## 2011-12-31 ENCOUNTER — Telehealth: Payer: Self-pay | Admitting: *Deleted

## 2011-12-31 NOTE — Telephone Encounter (Signed)
I agree I was looking at the wrong labs let us keep him on his Crestor. THank you.

## 2011-12-31 NOTE — Telephone Encounter (Signed)
Samples of Crestor 20 left at desk for Pt to pickup to use while waiting on his Rx assistance.

## 2011-12-31 NOTE — Telephone Encounter (Signed)
Please review most recent labs as I think you may have them confused w/ labs from previous years.

## 2012-02-17 ENCOUNTER — Telehealth: Payer: Self-pay | Admitting: Family Medicine

## 2012-02-17 NOTE — Telephone Encounter (Signed)
Patient walked in to pick up Crestor samples and states he called to have a prescription for Crestor also but I did not see a script in our drawer. Can you check on this and call him back about the prescription.Marland Kitchen

## 2012-02-17 NOTE — Telephone Encounter (Signed)
Pt needs prescription changed to [facility] pharmacy in [facility] on [address] instead of [address].Marland Kitchen

## 2012-02-18 MED ORDER — ROSUVASTATIN CALCIUM 20 MG PO TABS: 20.0000 mg | ORAL_TABLET | Freq: Every day | ORAL | Status: DC

## 2012-03-05 ENCOUNTER — Telehealth: Payer: Self-pay | Admitting: *Deleted

## 2012-03-05 MED ORDER — ATORVASTATIN CALCIUM 40 MG PO TABS: 40.0000 mg | ORAL_TABLET | Freq: Every day | ORAL | Status: DC

## 2012-03-05 NOTE — Telephone Encounter (Signed)
Pt states that the generic crestor we sent cost to much and would like to know will you prescribe something cheaper.

## 2012-03-05 NOTE — Telephone Encounter (Signed)
I thought he was on a low dose of Crestor before and we just increased the dosing. Crestor 20 is a very potent drug and Crestor is not a generic. His insurance plan may have stopped covering the cost of Crestor and asked why it is so expensive now. If he has never been on Lipitor and if he has never had side effects from statins we could try him on Lipitor 40 mg sensitization Minerva Areola but if that fails to keep his cholesterol down Crestor would be the next choice.

## 2012-03-05 NOTE — Telephone Encounter (Signed)
LMOM

## 2012-03-06 NOTE — Telephone Encounter (Signed)
Lipitor has been sent already. Pt aware.

## 2012-06-02 ENCOUNTER — Ambulatory Visit: Payer: Medicare Other | Admitting: Family Medicine

## 2012-07-06 ENCOUNTER — Ambulatory Visit (INDEPENDENT_AMBULATORY_CARE_PROVIDER_SITE_OTHER): Payer: Medicare Other

## 2012-07-06 ENCOUNTER — Ambulatory Visit (INDEPENDENT_AMBULATORY_CARE_PROVIDER_SITE_OTHER): Payer: Medicare Other | Admitting: Sports Medicine

## 2012-07-06 ENCOUNTER — Encounter: Payer: Self-pay | Admitting: Sports Medicine

## 2012-07-06 VITALS — BP 109/69 | HR 70 | Temp 97.5°F | Resp 18 | Wt 215.0 lb

## 2012-07-06 DIAGNOSIS — M25519 Pain in unspecified shoulder: Secondary | ICD-10-CM

## 2012-07-06 DIAGNOSIS — M25512 Pain in left shoulder: Secondary | ICD-10-CM | POA: Insufficient documentation

## 2012-07-06 MED ORDER — MELOXICAM 15 MG PO TABS: ORAL_TABLET | ORAL | Status: AC

## 2012-07-06 NOTE — Assessment & Plan Note (Signed)
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 He will stop his existing over-the-counter NSAIDs, and he'll use Mobic daily. I've also given him some rehabilitation exercises. I would like to see him back in 3-4 weeks, and if no better we will consider injecting his glenohumeral joint under ultrasound guidance.

## 2012-07-06 NOTE — Progress Notes (Signed)
Patient ID: Trevor Pearson, male   DOB: 03/23/1961, 51 y.o.   MRN: [id number] Subjective:    CC: left shoulder pain  HPI: Trevor Pearson is a very pleasant 51 year old male with a history of psoriasis,who comes in with complaints of left shoulder pain for approximately 2-3 weeks now. He localizes the pain in the shoulder joint anteriorly, and denies any mechanical symptoms. He denies any trauma, and notes the pain is worse with essentially any movement, but not necessarily overhead movements. It does wake him from sleep at night. He's tried Aleve which is only moderately beneficial. 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  The pain is localized, does not radiate to  Past medical history, Surgical history, Family history, Social history, Allergies, and medications have been entered into the medical record, reviewed, and no changes needed.   Review of Systems: No fevers, chills, night sweats, weight loss, chest pain, or shortness of breath.   Objective:    General: Well Developed, well nourished, and in no acute distress.  Neuro: Alert and oriented x3, extra-ocular muscles intact.  HEENT: Normocephalic, atraumatic, pupils equal round reactive to light, neck supple, no masses, no lymphadenopathy, thyroid nonpalpable.  Skin: multiple psoriatic plaques noted over the entire body. Cardiac: Regular rate and rhythm, no murmurs rubs or gallops.  Respiratory: Clear to auscultation bilaterally. Not using accessory muscles, speaking in full sentences. Shoulder: Inspection reveals no abnormalities, atrophy or asymmetry. Palpation is with only minimal tenderness over the bicipital groove. ROM is Limited in flexion, extension, abduction. Rotator cuff strength normal throughout. No signs of impingement with negative Neer and Hawkin's tests, empty can sign. Speeds and Yergason's tests normal. No labral pathology noted with negative Obrien's, negative clunk and good stability. Normal  scapular function observed. No painful arc and no drop arm sign. No apprehension sign  X-rays were ordered and interpreted by me and show only very mild DJD of the glenohumeral joint with a single small drop osteophyte off of the inferior aspect of the humeral head. He has a type I acromion.  No other abnormality seen on the x-ray   Impression and Recommendations:

## 2012-07-15 ENCOUNTER — Other Ambulatory Visit: Payer: Self-pay | Admitting: *Deleted

## 2012-07-15 MED ORDER — ATORVASTATIN CALCIUM 40 MG PO TABS: 40.0000 mg | ORAL_TABLET | Freq: Every day | ORAL | Status: DC

## 2012-07-16 ENCOUNTER — Ambulatory Visit: Payer: Medicare Other | Admitting: Family Medicine

## 2012-07-23 ENCOUNTER — Ambulatory Visit (INDEPENDENT_AMBULATORY_CARE_PROVIDER_SITE_OTHER): Payer: Medicare Other | Admitting: Sports Medicine

## 2012-07-23 ENCOUNTER — Ambulatory Visit: Payer: Medicare Other | Admitting: Family Medicine

## 2012-07-23 ENCOUNTER — Telehealth: Payer: Self-pay | Admitting: Family Medicine

## 2012-07-23 ENCOUNTER — Encounter: Payer: Self-pay | Admitting: Sports Medicine

## 2012-07-23 ENCOUNTER — Telehealth: Payer: Self-pay | Admitting: *Deleted

## 2012-07-23 VITALS — BP 114/73 | HR 70 | Ht 67.0 in | Wt 217.0 lb

## 2012-07-23 DIAGNOSIS — M25519 Pain in unspecified shoulder: Secondary | ICD-10-CM

## 2012-07-23 DIAGNOSIS — M25512 Pain in left shoulder: Secondary | ICD-10-CM

## 2012-07-23 DIAGNOSIS — E785 Hyperlipidemia, unspecified: Secondary | ICD-10-CM

## 2012-07-23 LAB — LIPID PANEL
Cholesterol: 138 mg/dL (ref 0–200)
Triglycerides: 104 mg/dL (ref <150)

## 2012-07-23 NOTE — Telephone Encounter (Signed)
Patient was here today in the office and wants to make sure that his lab work gets sent to Dr.Cuddle in [place]. Pt states we have sent them there before and would like to make sure he gets the ones from today as well.  Dr. Geradine Girt Phone number is [phone]... Thanks, [employer]

## 2012-07-23 NOTE — Progress Notes (Signed)
Patient ID: Trevor Pearson, male   DOB: 08/29/1961, 51 y.o.   MRN: [id number] Subjective:    CC: Followup of left shoulder pain  HPI: Trevor Pearson is a very pleasant 51 year old male with a history of psoriasis who comes in for followup of left shoulder pain. I saw him several weeks ago, and his symptoms were suggestive predominantly of glenohumeral pathology. I also suspected that he had some rotator cuff dysfunction.  We initiated conservative treatment with anti-inflammatories, as well as some home there. He has been very compliant with a home therapy, and overall reports that his pain is gone. He is very happy with the results.  Past medical history, Surgical history, Family history, Social history, Allergies, and medications have been entered into the medical record, reviewed, and no changes needed.   Review of Systems: No fevers, chills, night sweats, weight loss, chest pain, or shortness of breath.   Objective:    General: Well Developed, well nourished, and in no acute distress.  Left Shoulder: Inspection reveals no abnormalities, atrophy or asymmetry. Palpation is normal with no tenderness over AC joint or bicipital groove. ROM is full in all planes. Rotator cuff strength normal throughout. No signs of impingement with negative Neer and Hawkin's tests, empty can sign. Speeds and Yergason's tests normal. No labral pathology noted with negative Obrien's, negative clunk and good stability. Normal scapular function observed. No painful arc and no drop arm sign. No apprehension sign  Impression and Recommendations:

## 2012-07-23 NOTE — Telephone Encounter (Signed)
Victorino Dike if you can make that happen tomorrow or Wednesday that wil be great. I think you have Dr Geradine Girt #.

## 2012-07-23 NOTE — Telephone Encounter (Signed)
Lab order entered.

## 2012-07-23 NOTE — Assessment & Plan Note (Signed)
Pain was most likely related to glenohumeral, as well as rotator cuff dysfunction. He has done well with conservative therapy, and rehabilitation exercises. I will see him back on an as-needed basis, should his pain recur, we can consider an ultrasound-guided glenohumeral joint injection.

## 2012-07-27 ENCOUNTER — Ambulatory Visit: Payer: Medicare Other | Admitting: Sports Medicine

## 2012-08-04 ENCOUNTER — Ambulatory Visit: Payer: Medicare Other | Admitting: Family Medicine

## 2012-08-04 ENCOUNTER — Encounter: Payer: Self-pay | Admitting: Family Medicine

## 2012-08-04 ENCOUNTER — Ambulatory Visit (INDEPENDENT_AMBULATORY_CARE_PROVIDER_SITE_OTHER): Payer: Medicare Other | Admitting: Family Medicine

## 2012-08-04 VITALS — BP 121/69 | HR 78 | Ht 67.0 in | Wt 216.0 lb

## 2012-08-04 DIAGNOSIS — F319 Bipolar disorder, unspecified: Secondary | ICD-10-CM

## 2012-08-04 DIAGNOSIS — Z23 Encounter for immunization: Secondary | ICD-10-CM

## 2012-08-04 DIAGNOSIS — Z0000 Encounter for general adult medical examination without abnormal findings: Secondary | ICD-10-CM

## 2012-08-04 DIAGNOSIS — L409 Psoriasis, unspecified: Secondary | ICD-10-CM

## 2012-08-04 DIAGNOSIS — Z1211 Encounter for screening for malignant neoplasm of colon: Secondary | ICD-10-CM

## 2012-08-04 DIAGNOSIS — E785 Hyperlipidemia, unspecified: Secondary | ICD-10-CM

## 2012-08-04 LAB — POCT URINALYSIS DIPSTICK
Bilirubin, UA: NEGATIVE
Glucose, UA: NEGATIVE
Spec Grav, UA: >=1.03
Urobilinogen, UA: 1

## 2012-08-04 LAB — HEMOCCULT GUIAC POC 1CARD (OFFICE): Fecal Occult Blood, POC: NEGATIVE

## 2012-08-04 NOTE — Progress Notes (Signed)
Subjective:    Patient ID: Trevor Pearson, male    DOB: [birth year], 51 y.o.   MRN: [id number]  HPI  The patient is here for yearly examination.  Past history of psoriasis, Hyperlipidemia and bipolar disease. He has had a recent lipid panel but has asked me to get a Depakote level when he obtains his other blood work and send it to his psychiatrist. He she is taking Depakote and Abilify for his bipolar disease and Enbrel for his psoriasis. He is out of his Lipitor he asked if I have any samples which I think at 2 to get him over this week when he does not think he will have any. Review of Systems  Constitutional: Negative for fever, fatigue and unexpected weight change.  Skin:        He states his psoriasis is under good control.  All other systems reviewed and are negative.   No Known Allergies History   Social History  . Marital Status: Married    Spouse Name: N/A    Number of Children: N/A  . Years of Education: N/A   Occupational History  . Not on file.   Social History Main Topics  . Smoking status: Never Smoker   . Smokeless tobacco: Never Used  . Alcohol Use: No  . Drug Use: No  . Sexually Active: Yes    Birth Control/ Protection: Surgical   Other Topics Concern  . Not on file   Social History Narrative  . No narrative on file   Family History  Problem Relation Age of Onset  . Breast cancer Mother   . Diabetes Mother   . Hyperlipidemia Mother   . Hypertension Mother   . Lung cancer Father   . Obesity Sister   . Hyperlipidemia Brother   . Mental illness Son    Past Medical History  Diagnosis Date  . ADHD (attention deficit hyperactivity disorder)   . Hyperlipidemia   . Psoriasis   . Bipolar affective disorder   . Psoriasis    Past Surgical History  Procedure Date  . Biopsy of liver        BP 121/69  Pulse 78  Ht 5\' 7"  (1.702 m)  Wt 216 lb (97.977 kg)  BMI 33.83 kg/m2  SpO2 96% Objective:   Physical Exam  Constitutional: He is oriented to  person, place, and time. He appears well-developed and well-nourished. No distress.  HENT:  Head: Normocephalic and atraumatic.  Eyes: Conjunctivae normal and EOM are normal. Pupils are equal, round, and reactive to light. Right pupil is reactive. Left pupil is reactive.  Fundoscopic exam:      The right eye shows no arteriolar narrowing, no AV nicking and no exudate.       The left eye shows no arteriolar narrowing and no AV nicking.  Neck: Normal range of motion. Neck supple. No tracheal deviation present. No thyromegaly present.  Cardiovascular: Normal rate, regular rhythm and normal heart sounds.  Exam reveals no friction rub.   No murmur heard. Pulmonary/Chest: Effort normal and breath sounds normal. No respiratory distress. He has no wheezes.  Abdominal: Soft. Bowel sounds are normal. He exhibits no distension. There is no tenderness. There is no rebound. Hernia confirmed negative in the right inguinal area and confirmed negative in the left inguinal area.  Genitourinary: Rectum normal, prostate normal, testes normal and penis normal. Rectal exam shows no fissure, no mass, no tenderness and anal tone normal. Guaiac negative stool. Cremasteric reflex is present.  Right testis shows no mass, no swelling and no tenderness. Left testis shows no mass, no swelling and no tenderness. Circumcised. No penile erythema or penile tenderness. No discharge found.  Musculoskeletal: Normal range of motion. He exhibits no edema.  Lymphadenopathy:    He has no cervical adenopathy.       Right: No inguinal adenopathy present.       Left: No inguinal adenopathy present.  Neurological: He is alert and oriented to person, place, and time. He has normal reflexes. No cranial nerve deficit. Coordination normal.  Skin: Skin is dry. Rash noted.       Marked psoriasis over the entire body. Minimal amount of erythema present.  Psychiatric: He has a normal mood and affect. His behavior is normal. Thought content normal.     Results for orders placed in visit on 08/04/12  POCT URINALYSIS DIPSTICK      Component Value Range   Color, UA yellow     Clarity, UA clear     Glucose, UA neg     Bilirubin, UA neg     Ketones, UA trace     Spec Grav, UA >=1.030     Blood, UA small     pH, UA 7.0     Protein, UA neg     Urobilinogen, UA 1.0     Nitrite, UA neg     Leukocytes, UA Negative    HEMOCCULT GUIAC POC 1CARD (OFFICE)      Component Value Range   Fecal Occult Blood, POC Negative     Card #1 Date       Card #2 Fecal Occult Blod, POC       Card #2 Date       Card #3 Fecal Occult Blood, POC       Card #3 Date        Results for orders placed in visit on 07/23/12  LIPID PANEL      Component Value Range   Cholesterol 138  0 - 200 mg/dL   Triglycerides 782  <956 mg/dL   HDL 30 (*) >21 mg/dL   Total CHOL/HDL Ratio 4.6     VLDL 21  0 - 40 mg/dL   LDL Cholesterol 87  0 - 99 mg/dL     EKG/ WNL Assessment & Plan:   #1 hyperlipidemia. Cholesterol looks great her know that with him why he was here. His HDL be nice for was low but higher but otherwise parameters were good. #2 Bipolar disease. Will continue him with his psychiatrist. Maryclare Labrador get a Depakote level and encouraged him to continue the Depakote and Abilify.  #3 health maintenance. Will obtain a PSA. Discussed with him about getting a colonoscopy after his 52th birthday next year when he returns for his yearly exam. Will minister flu vaccination since he is on an immunosuppressive drug. #4 psoriasis. Return for followup in about 6 months.  Form filled for completion of his yearly exam.

## 2012-08-04 NOTE — Patient Instructions (Addendum)

## 2012-08-05 LAB — CBC WITH DIFFERENTIAL/PLATELET
Basophils Absolute: 0 10*3/uL (ref 0.0–0.1)
Eosinophils Absolute: 0.1 10*3/uL (ref 0.0–0.7)
Eosinophils Relative: 1 % (ref 0–5)
Lymphocytes Relative: 35 % (ref 12–46)
Lymphs Abs: 1.9 10*3/uL (ref 0.7–4.0)
MCH: 30.7 pg (ref 26.0–34.0)
MCV: 89.8 fL (ref 78.0–100.0)
Neutrophils Relative %: 52 % (ref 43–77)
Platelets: 127 10*3/uL — ABNORMAL LOW (ref 150–400)
RBC: 4.92 MIL/uL (ref 4.22–5.81)
RDW: 14.3 % (ref 11.5–15.5)
WBC: 5.4 10*3/uL (ref 4.0–10.5)

## 2012-08-05 LAB — PSA, TOTAL AND FREE
PSA, Free: 0.6 ng/mL
PSA: 1.14 ng/mL (ref <=4.00)

## 2012-08-05 LAB — COMPREHENSIVE METABOLIC PANEL
ALT: 23 U/L (ref 0–53)
AST: 26 U/L (ref 0–37)
CO2: 30 mEq/L (ref 19–32)
Creat: 0.9 mg/dL (ref 0.50–1.35)
Sodium: 141 mEq/L (ref 135–145)
Total Bilirubin: 0.5 mg/dL (ref 0.3–1.2)
Total Protein: 6.6 g/dL (ref 6.0–8.3)

## 2012-08-05 LAB — VALPROIC ACID LEVEL: Valproic Acid Lvl: 139.1 ug/mL — ABNORMAL HIGH (ref 50.0–100.0)

## 2012-08-06 ENCOUNTER — Telehealth: Payer: Self-pay | Admitting: *Deleted

## 2012-08-06 ENCOUNTER — Encounter: Payer: Medicare Other | Admitting: Family Medicine

## 2012-08-06 DIAGNOSIS — Z0000 Encounter for general adult medical examination without abnormal findings: Secondary | ICD-10-CM

## 2013-01-07 ENCOUNTER — Other Ambulatory Visit: Payer: Self-pay | Admitting: *Deleted

## 2013-01-07 MED ORDER — ATORVASTATIN CALCIUM 40 MG PO TABS: 40.0000 mg | ORAL_TABLET | Freq: Every day | ORAL | Status: DC

## 2013-01-29 ENCOUNTER — Other Ambulatory Visit: Payer: Self-pay | Admitting: *Deleted

## 2013-01-29 MED ORDER — ATORVASTATIN CALCIUM 40 MG PO TABS: 40.0000 mg | ORAL_TABLET | Freq: Every day | ORAL | Status: DC

## 2013-02-02 ENCOUNTER — Ambulatory Visit: Payer: Medicare Other | Admitting: Family Medicine

## 2013-03-16 ENCOUNTER — Other Ambulatory Visit: Payer: Self-pay | Admitting: *Deleted

## 2013-03-16 MED ORDER — ATORVASTATIN CALCIUM 40 MG PO TABS: 40.0000 mg | ORAL_TABLET | Freq: Every day | ORAL | Status: DC

## 2013-04-20 ENCOUNTER — Telehealth: Payer: Self-pay | Admitting: *Deleted

## 2013-04-20 DIAGNOSIS — E785 Hyperlipidemia, unspecified: Secondary | ICD-10-CM

## 2013-04-20 MED ORDER — ATORVASTATIN CALCIUM 40 MG PO TABS: 40.0000 mg | ORAL_TABLET | Freq: Every day | ORAL | Status: AC

## 2013-04-20 NOTE — Telephone Encounter (Signed)
Pt calls today & states that he was a [facility] pt but hasn't been able get an appt with due to no ins.  He now has ins but it's medicaid & he can't come here.  He wants to know if you will fill his lipitor until he can get in with another dr.  If so, it goes to the [facility] on [address] in [place].  Please advise

## 2013-04-20 NOTE — Telephone Encounter (Signed)
I think this is ok, he had labwork done back in September 2013.  I'll call in two months worth of an Rx.  Will you please let him know that [facility] takes Medicaid and should be able to get him in within that window.

## 2013-04-21 NOTE — Telephone Encounter (Signed)
Left message on vm with this advise

## 2013-12-06 IMAGING — CR DG SHOULDER 2+V*L*
3 series · 3 of 3 positions shown · non-contrast
Comparison: None.

CLINICAL DATA: Left shoulder pain.

LEFT SHOULDER - 2+ VIEW

[view not recorded (1 of 3)]
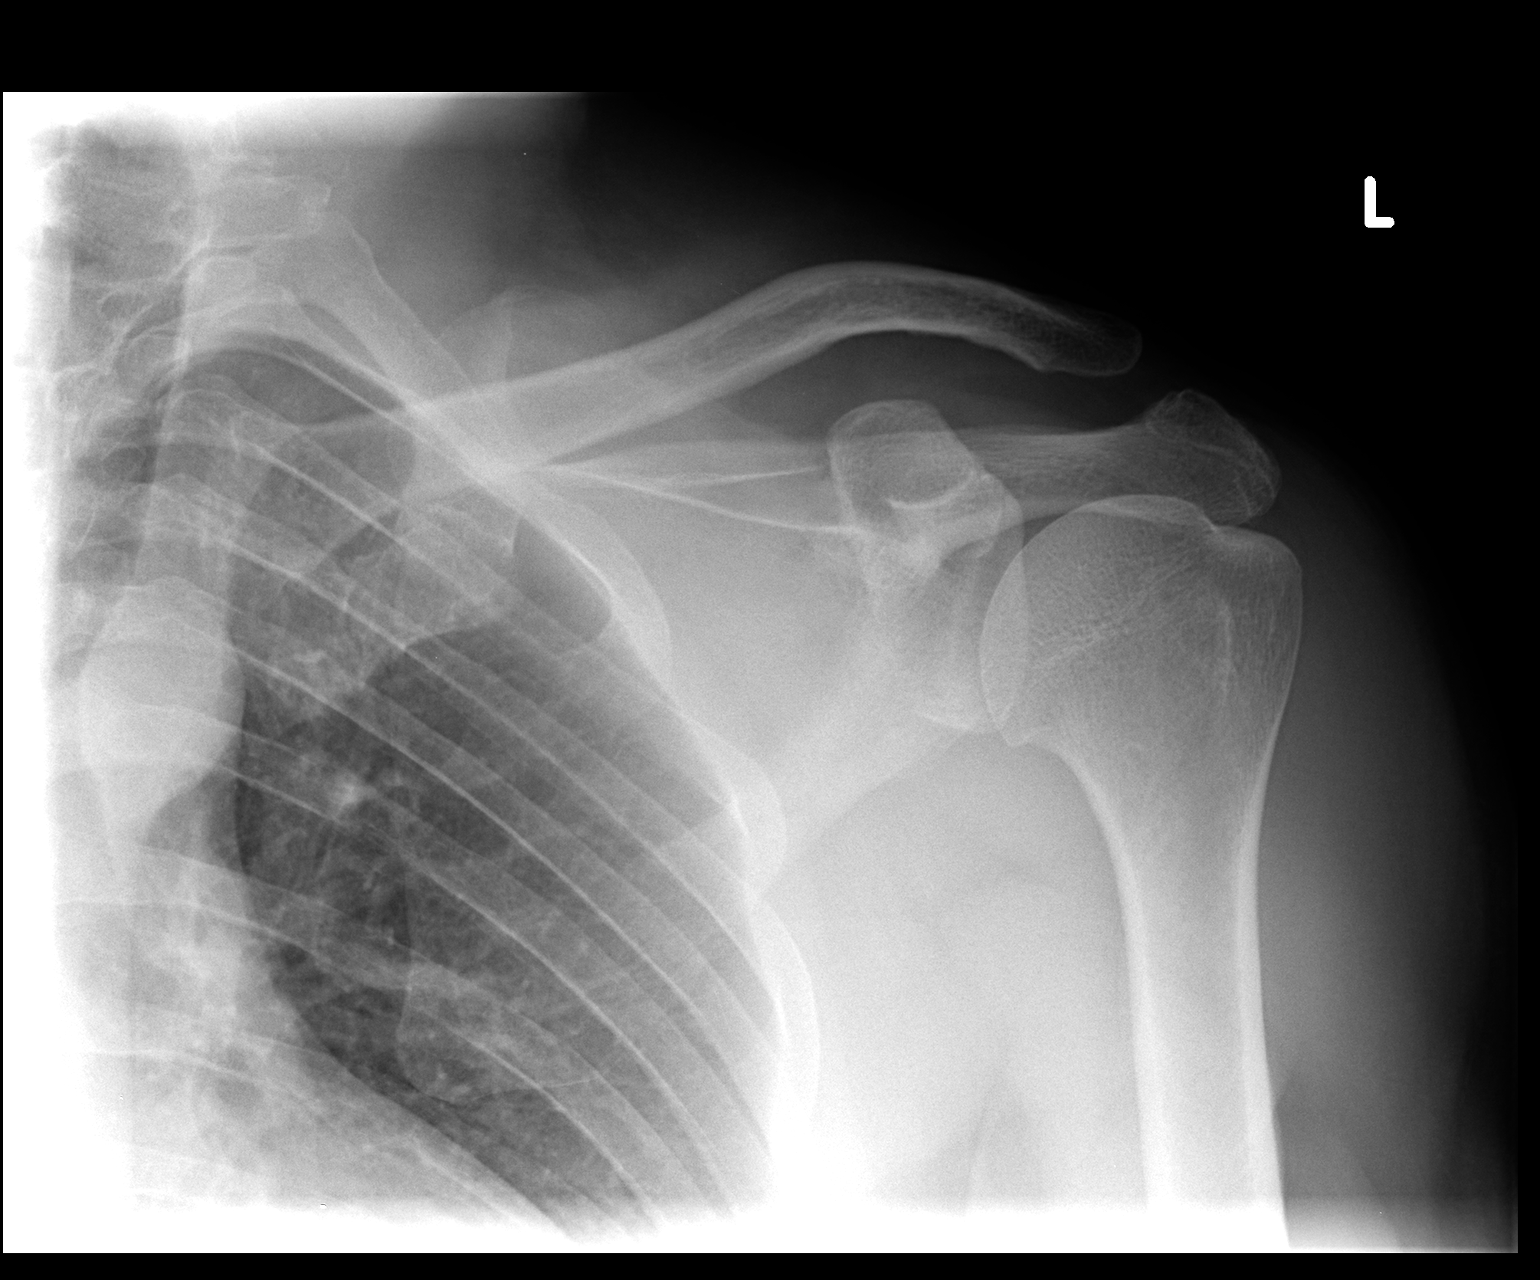

[view not recorded (2 of 3)]
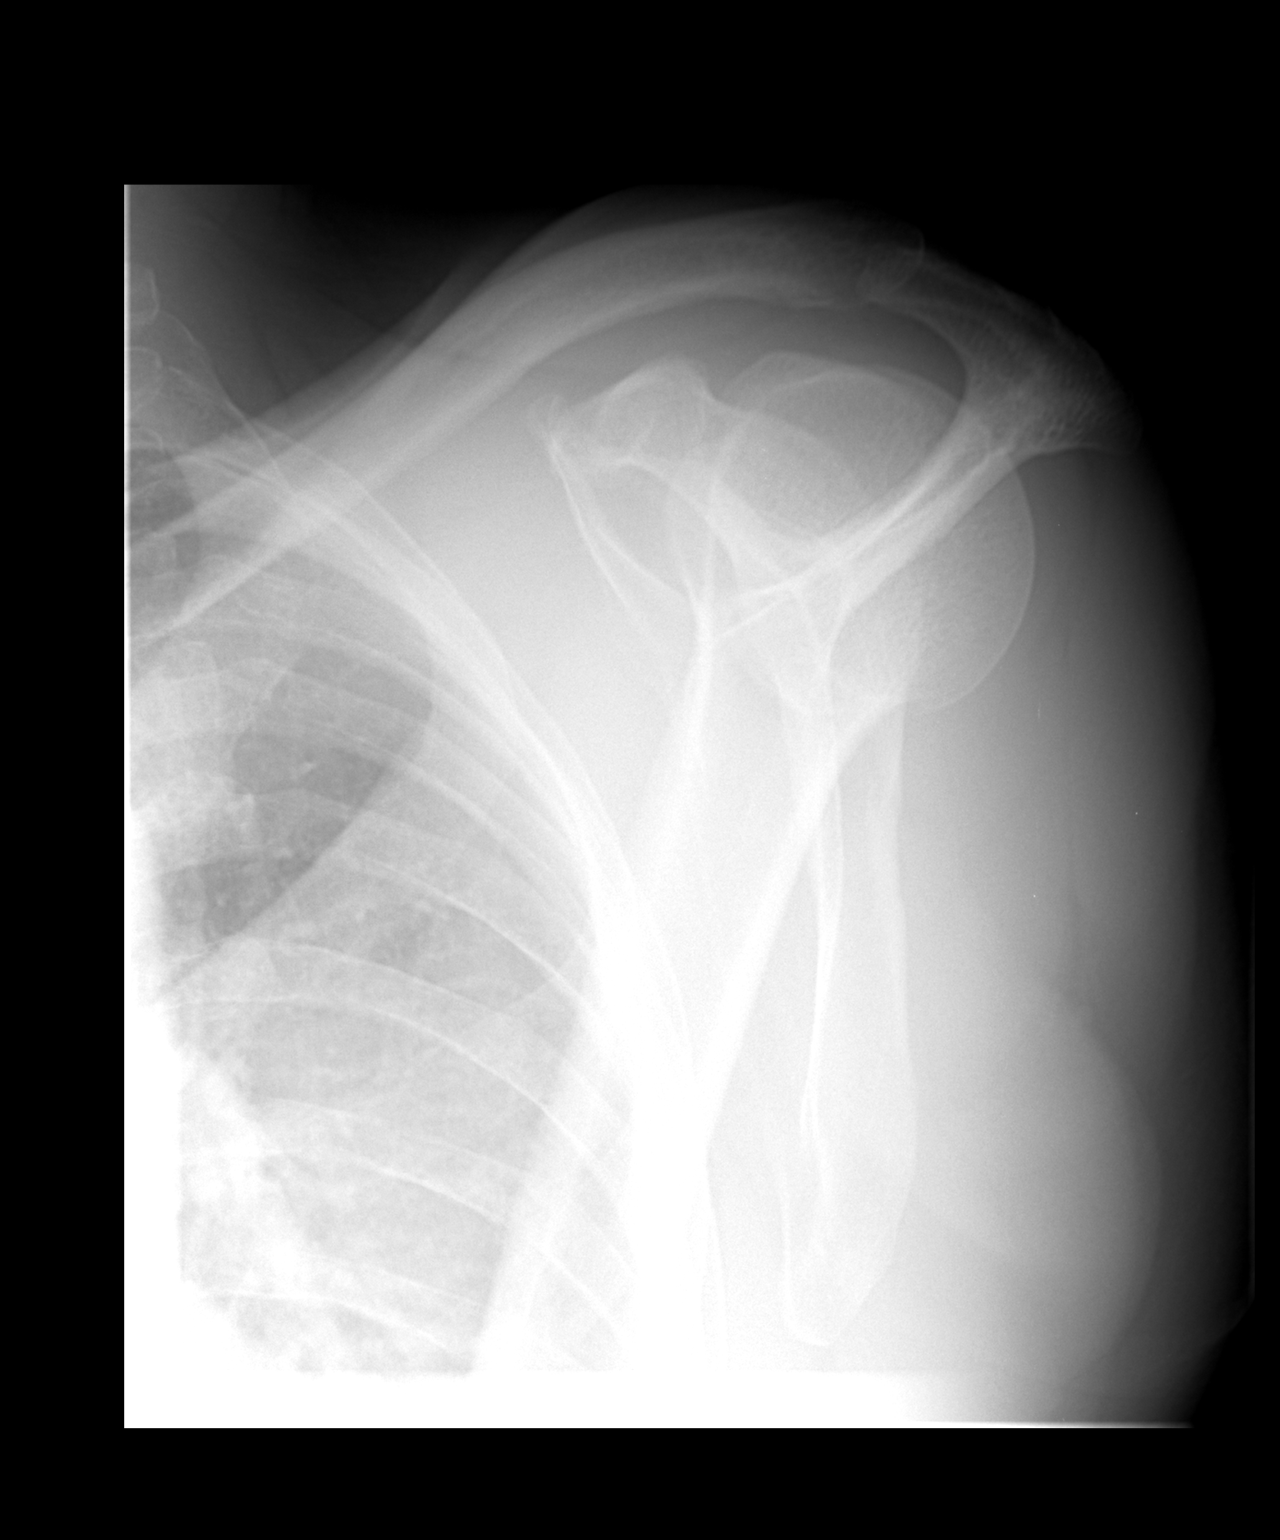

[view not recorded (3 of 3)]
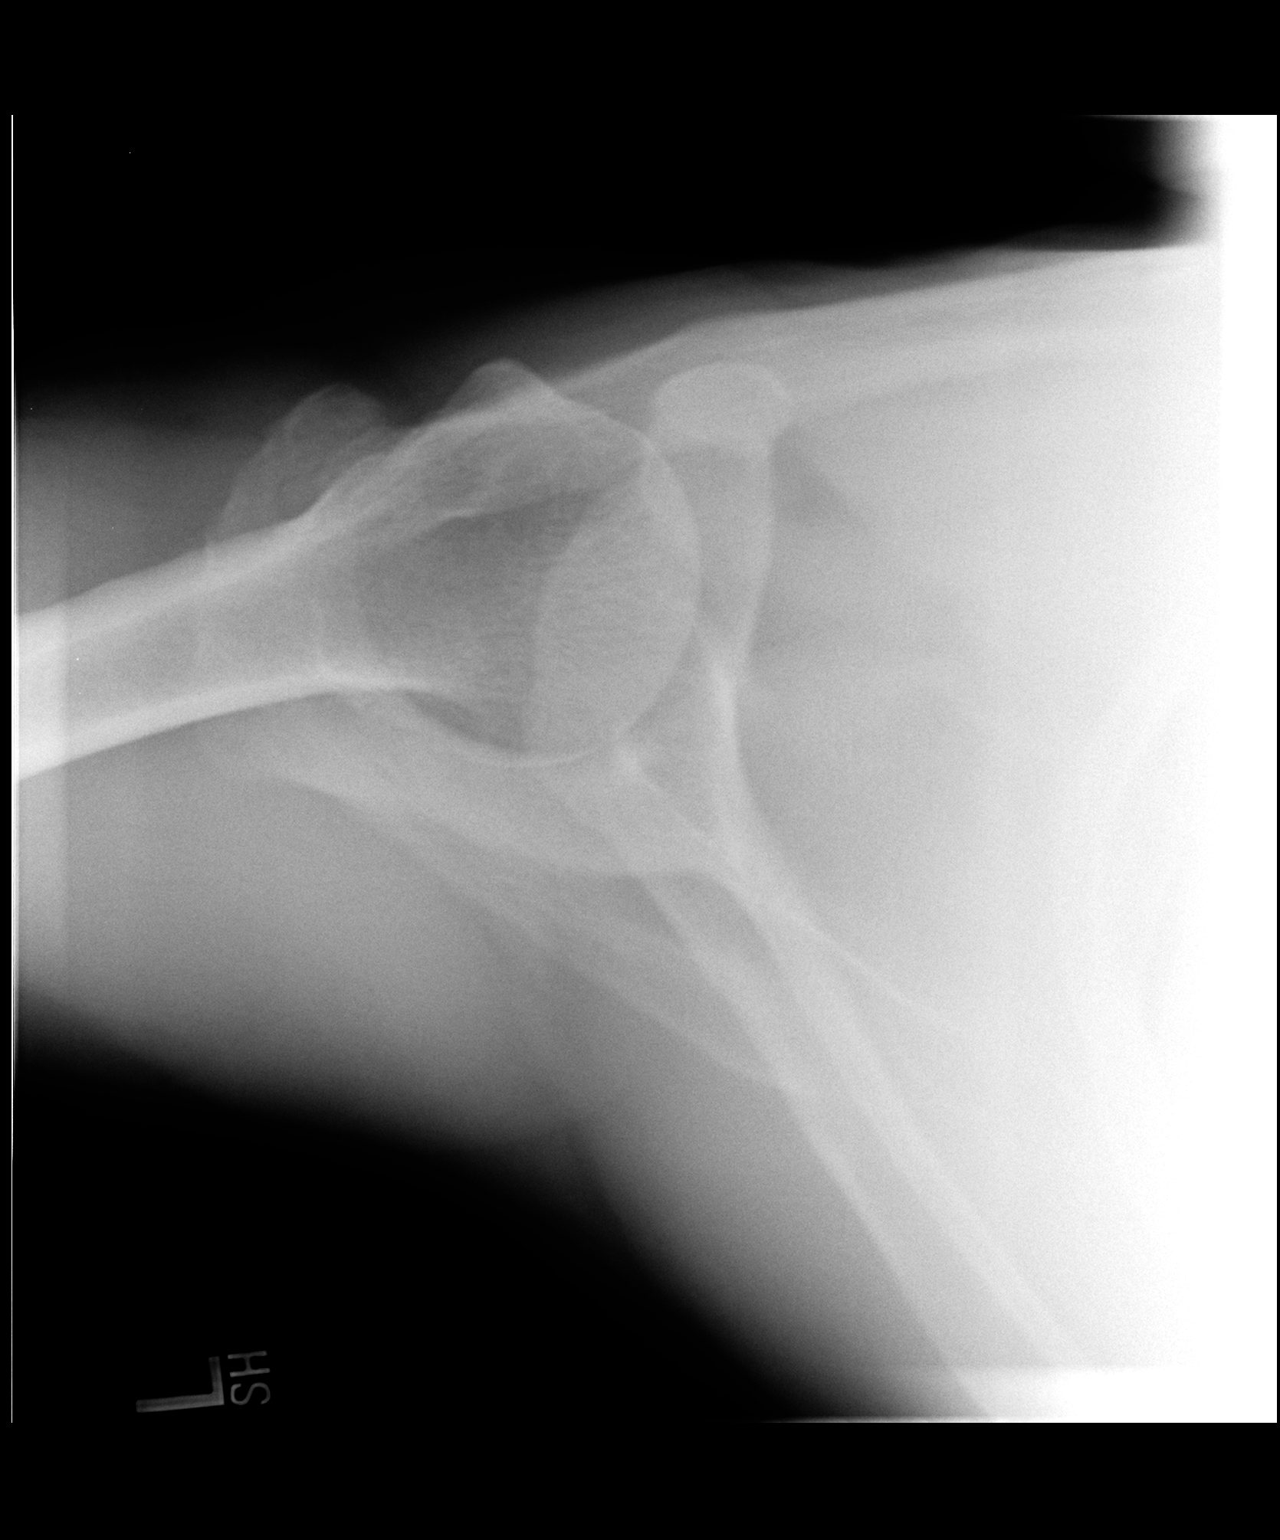

[3 of 3 positions shown; findings below may reference images not displayed]

FINDINGS: No acute osseous or joint abnormality. Visualized portion
of the left chest is unremarkable.
IMPRESSION: No acute osseous or joint abnormality.

## 2018-09-30 ENCOUNTER — Other Ambulatory Visit: Payer: Self-pay | Admitting: Psychiatry

## 2018-10-01 ENCOUNTER — Other Ambulatory Visit: Payer: Self-pay | Admitting: Psychiatry

## 2018-10-02 NOTE — Telephone Encounter (Signed)
Need to review paper chart  

## 2018-11-25 ENCOUNTER — Encounter: Payer: Self-pay | Admitting: Emergency Medicine

## 2018-11-25 DIAGNOSIS — F429 Obsessive-compulsive disorder, unspecified: Secondary | ICD-10-CM | POA: Insufficient documentation

## 2018-11-25 DIAGNOSIS — F423 Hoarding disorder: Secondary | ICD-10-CM

## 2018-12-24 ENCOUNTER — Ambulatory Visit: Payer: Self-pay | Admitting: Psychiatry

## 2019-01-07 ENCOUNTER — Other Ambulatory Visit: Payer: Self-pay

## 2019-01-07 MED ORDER — ARIPIPRAZOLE 30 MG PO TABS: 30.0000 mg | ORAL_TABLET | Freq: Every day | ORAL | 0 refills | Status: DC

## 2019-01-21 ENCOUNTER — Ambulatory Visit: Payer: Self-pay | Admitting: Psychiatry

## 2019-02-04 ENCOUNTER — Ambulatory Visit: Payer: Self-pay | Admitting: Psychiatry

## 2019-02-04 ENCOUNTER — Other Ambulatory Visit: Payer: Self-pay | Admitting: Psychiatry

## 2019-02-05 NOTE — Telephone Encounter (Signed)
Not seen in epic need to review paper chart  

## 2019-07-01 ENCOUNTER — Other Ambulatory Visit: Payer: Self-pay | Admitting: Psychiatry

## 2019-07-01 NOTE — Telephone Encounter (Signed)
Check last appt nothing in epic

## 2019-07-02 NOTE — Telephone Encounter (Signed)
He's not been seen since 06/2018, did not keep follow up in October.   [place] office informed to have him make appt, refill okay or wait till appt made?

## 2019-07-12 ENCOUNTER — Ambulatory Visit (INDEPENDENT_AMBULATORY_CARE_PROVIDER_SITE_OTHER): Payer: Medicare HMO | Admitting: Psychiatry

## 2019-07-12 ENCOUNTER — Other Ambulatory Visit: Payer: Self-pay

## 2019-07-12 ENCOUNTER — Encounter: Payer: Self-pay | Admitting: Psychiatry

## 2019-07-12 DIAGNOSIS — F423 Hoarding disorder: Secondary | ICD-10-CM | POA: Diagnosis not present

## 2019-07-12 DIAGNOSIS — F310 Bipolar disorder, current episode hypomanic: Secondary | ICD-10-CM | POA: Diagnosis not present

## 2019-07-12 DIAGNOSIS — F901 Attention-deficit hyperactivity disorder, predominantly hyperactive type: Secondary | ICD-10-CM

## 2019-07-12 MED ORDER — DIVALPROEX SODIUM 500 MG PO DR TAB: 1000.0000 mg | DELAYED_RELEASE_TABLET | Freq: Two times a day (BID) | ORAL | 1 refills | Status: DC

## 2019-07-12 MED ORDER — ARIPIPRAZOLE 30 MG PO TABS: 30.0000 mg | ORAL_TABLET | Freq: Every day | ORAL | 0 refills | Status: DC

## 2019-07-12 NOTE — Progress Notes (Signed)
Trevor SaranRuben A Kirkendoll [id number] 04/29/1961 58 y.o.  Virtual Visit via Video Note  I connected with Trevor Saranuben A Pearson on 07/12/19 at  9:15 AM EDT by a video enabled telemedicine application and verified that I am speaking with the correct person using two identifiers.   Location: Patient: Trevor DallasRuben Pearson at home Provider: Dr. Jennelle Humanottle at office   I discussed the limitations of evaluation and management by telemedicine and the availability of in person appointments. The patient expressed understanding and agreed to proceed.  I discussed the assessment and treatment plan with the patient. The patient was provided an opportunity to ask questions and all were answered. The patient agreed with the plan and demonstrated an understanding of the instructions.   The patient was advised to call back or seek an in-person evaluation if the symptoms worsen or if the condition fails to improve as anticipated.  I provided 20 minutes of non-face-to-face time during this encounter.   Lauraine Rinnearey G Cottle Jr, MD    Subjective:   Patient ID:  Trevor Pearson is a 58 y.o. (DOB 10/15/1961) male.  Chief Complaint:  Chief Complaint  Patient presents with  . Follow-up    bipolar and OCD    HPI Trevor SaranRuben A Kilbride presents for follow-up of bipolar and OCD, and ADHD , dyslexia and ED.  Last seen August 2019.  No meds changed.  Overall doing well.  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 Patient reports stable mood and denies depressed or irritable moods.  Patient denies any recent difficulty with anxiety.  Patient denies difficulty with sleep initiation or maintenance. Denies appetite disturbance.  Patient reports that energy and motivation have been good.  Patient denies any difficulty with concentration.  Patient denies any suicidal ideation.  Past Psychiatric Medication Trials: history NR stimulants and wired up,      Review of Systems:  Review of Systems  Neurological: Negative for tremors and  weakness.    Medications: I have reviewed the patient's current medications.  Current Outpatient Medications  Medication Sig Dispense Refill  . ARIPiprazole (ABILIFY) 30 MG tablet Take 1 tablet (30 mg total) by mouth daily. 90 tablet 0  . atorvastatin (LIPITOR) 40 MG tablet Take 1 tablet (40 mg total) by mouth daily. 60 tablet 0  . divalproex (DEPAKOTE) 500 MG DR tablet Take 2 tablets by mouth twice daily 120 tablet 0  . etanercept (ENBREL SURECLICK) 50 MG/ML injection Inject 50 mg into the skin once a week.       No current facility-administered medications for this visit.     Medication Side Effects: None  Allergies: No Known Allergies  Past Medical History:  Diagnosis Date  . ADHD (attention deficit hyperactivity disorder)   . Bipolar affective disorder (HCC)   . Hyperlipidemia   . Psoriasis   . Psoriasis     Family History  Problem Relation Age of Onset  . Breast cancer Mother   . Diabetes Mother   . Hyperlipidemia Mother   . Hypertension Mother   . Lung cancer Father   . Obesity Sister   . Hyperlipidemia Brother   . Mental illness Son     Social History   Socioeconomic History  . Marital status: Married    Spouse name: Not on file  . Number of children: Not on file  . Years of education: Not on file  . Highest education level: Not on file  Occupational History  . Not on file  Social Needs  .  Financial resource strain: Not on file  . Food insecurity    Worry: Not on file    Inability: Not on file  . Transportation needs    Medical: Not on file    Non-medical: Not on file  Tobacco Use  . Smoking status: Never Smoker  . Smokeless tobacco: Never Used  Substance and Sexual Activity  . Alcohol use: No  . Drug use: No  . Sexual activity: Yes    Birth control/protection: Surgical  Lifestyle  . Physical activity    Days per week: Not on file    Minutes per session: Not on file  . Stress: Not on file  Relationships  . Social Musicianconnections    Talks on  phone: Not on file    Gets together: Not on file    Attends religious service: Not on file    Active member of club or organization: Not on file    Attends meetings of clubs or organizations: Not on file    Relationship status: Not on file  . Intimate partner violence    Fear of current or ex partner: Not on file    Emotionally abused: Not on file    Physically abused: Not on file    Forced sexual activity: Not on file  Other Topics Concern  . Not on file  Social History Narrative  . Not on file    Past Medical History, Surgical history, Social history, and Family history were reviewed and updated as appropriate.   Please see review of systems for further details on the patient's review from today.   Objective:   Physical Exam:  There were no vitals taken for this visit.  Physical Exam Neurological:     Mental Status: He is alert and oriented to person, place, and time.     Cranial Nerves: No dysarthria.  Psychiatric:        Attention and Perception: He is inattentive. He does not perceive auditory or visual hallucinations.        Mood and Affect: Mood is not anxious or depressed.        Speech: Speech normal.        Behavior: Behavior is cooperative.        Thought Content: Thought content normal. Thought content is not paranoid or delusional. Thought content does not include homicidal or suicidal ideation. Thought content does not include homicidal or suicidal plan.        Cognition and Memory: Cognition and memory normal.        Judgment: Judgment normal.     Comments: Chronically hyperactive and hyperverbal.     Lab Review:     Component Value Date/Time   NA 141 08/04/2012 1452   K 4.1 08/04/2012 1452   CL 104 08/04/2012 1452   CO2 30 08/04/2012 1452   GLUCOSE 93 08/04/2012 1452   BUN 18 08/04/2012 1452   CREATININE 0.90 08/04/2012 1452   CALCIUM 9.1 08/04/2012 1452   PROT 6.6 08/04/2012 1452   ALBUMIN 4.2 08/04/2012 1452   AST 26 08/04/2012 1452   ALT 23  08/04/2012 1452   ALKPHOS 36 (L) 08/04/2012 1452   BILITOT 0.5 08/04/2012 1452   GFRNONAA >60 06/27/2011 1023   GFRAA >60 06/27/2011 1023       Component Value Date/Time   WBC 5.4 08/04/2012 1452   RBC 4.92 08/04/2012 1452   HGB 15.1 08/04/2012 1452   HCT 44.2 08/04/2012 1452   PLT 127 (L) 08/04/2012 1452  MCV 89.8 08/04/2012 1452   MCH 30.7 08/04/2012 1452   MCHC 34.2 08/04/2012 1452   RDW 14.3 08/04/2012 1452   LYMPHSABS 1.9 08/04/2012 1452   MONOABS 0.6 08/04/2012 1452   EOSABS 0.1 08/04/2012 1452   BASOSABS 0.0 08/04/2012 1452    No results found for: POCLITH, LITHIUM   Lab Results  Component Value Date   VALPROATE 139.1 (H) 08/04/2012     .res Assessment: Plan:    Amron was seen today for follow-up.  Diagnoses and all orders for this visit:  Bipolar I disorder, most recent episode hypomanic ([place])  Hoarding disorder  Attention deficit hyperactivity disorder (ADHD), predominantly hyperactive type  Chronically hyperactive, hyperverbal, intense but manageable and without changing.  Can manage PT work.    No problems with meds.  No indication for changes and patient agrees.  Discussed potential metabolic side effects associated with atypical antipsychotics, as well as potential risk for movement side effects. Advised pt to contact office if movement side effects occur.  No evidence for movement disorder.  Remains disabled from [employer] work DT longterm instability, hyperactivity, cognitive problems, inattention and social issues these sx cause.  Had labs at PCP and was told they were OK but will get results with ROI.  Not available via Epic.  Disc pricing of meds and GoodRX.  FU 6 mos  Lynder Parents, MD, DFAPA     Please see After Visit Summary for patient specific instructions.  No future appointments.  No orders of the defined types were placed in this encounter.     -------------------------------

## 2019-07-16 ENCOUNTER — Other Ambulatory Visit: Payer: Self-pay | Admitting: Psychiatry

## 2019-07-16 DIAGNOSIS — F310 Bipolar disorder, current episode hypomanic: Secondary | ICD-10-CM

## 2019-08-17 ENCOUNTER — Other Ambulatory Visit: Payer: Self-pay

## 2019-08-17 ENCOUNTER — Telehealth: Payer: Self-pay | Admitting: Psychiatry

## 2019-08-17 DIAGNOSIS — F310 Bipolar disorder, current episode hypomanic: Secondary | ICD-10-CM

## 2019-08-17 NOTE — Telephone Encounter (Signed)
Patient stated he needs refill on generic Depakote 500 mg, was suppose to send the rest of medication  to [facility] in [place] [address], patient stated he's getting low on medication he was only prescribed a 30 day script and he is running low

## 2019-08-17 NOTE — Telephone Encounter (Signed)
Spoke with patient and he had been giving them the old Rx bottle and explained he had an updated Rx sent 07/12/2019 for 6 month supply. Advised to call back if pharmacy doesn't have on file.

## 2019-11-15 ENCOUNTER — Other Ambulatory Visit: Payer: Self-pay | Admitting: Psychiatry

## 2019-11-15 DIAGNOSIS — F310 Bipolar disorder, current episode hypomanic: Secondary | ICD-10-CM

## 2019-11-15 DIAGNOSIS — F423 Hoarding disorder: Secondary | ICD-10-CM

## 2020-01-31 ENCOUNTER — Telehealth: Payer: Self-pay | Admitting: Psychiatry

## 2020-01-31 ENCOUNTER — Other Ambulatory Visit: Payer: Self-pay

## 2020-01-31 DIAGNOSIS — F423 Hoarding disorder: Secondary | ICD-10-CM

## 2020-01-31 DIAGNOSIS — F310 Bipolar disorder, current episode hypomanic: Secondary | ICD-10-CM

## 2020-01-31 MED ORDER — DIVALPROEX SODIUM 500 MG PO DR TAB: 1000.0000 mg | DELAYED_RELEASE_TABLET | Freq: Two times a day (BID) | ORAL | 0 refills | Status: DC

## 2020-01-31 MED ORDER — ARIPIPRAZOLE 30 MG PO TABS: 30.0000 mg | ORAL_TABLET | Freq: Every day | ORAL | 0 refills | Status: DC

## 2020-01-31 NOTE — Telephone Encounter (Signed)
Updated pharmacy to [facility] and submitted both Rx's for 90 day supply. Last visit 06/2019, due back for follow up.

## 2020-01-31 NOTE — Telephone Encounter (Signed)
Pt insurance changed from [place] to [employer] PPO. Sub ID# [id number]. Please send Aripiprazole, Divalproex to [facility] [phone].

## 2020-04-11 ENCOUNTER — Other Ambulatory Visit: Payer: Self-pay

## 2020-04-11 ENCOUNTER — Ambulatory Visit (INDEPENDENT_AMBULATORY_CARE_PROVIDER_SITE_OTHER): Payer: Medicare Other | Admitting: Psychiatry

## 2020-04-11 ENCOUNTER — Encounter: Payer: Self-pay | Admitting: Psychiatry

## 2020-04-11 DIAGNOSIS — F901 Attention-deficit hyperactivity disorder, predominantly hyperactive type: Secondary | ICD-10-CM

## 2020-04-11 DIAGNOSIS — F423 Hoarding disorder: Secondary | ICD-10-CM

## 2020-04-11 DIAGNOSIS — F310 Bipolar disorder, current episode hypomanic: Secondary | ICD-10-CM

## 2020-04-11 MED ORDER — DIVALPROEX SODIUM 500 MG PO DR TAB: 1000.0000 mg | DELAYED_RELEASE_TABLET | Freq: Two times a day (BID) | ORAL | 1 refills | Status: DC

## 2020-04-11 NOTE — Progress Notes (Signed)
Trevor Pearson [id number] 1960-11-28 59 y.o.   Subjective:   Patient ID:  Trevor Pearson is a 59 y.o. (DOB 03-Mar-1961) male.  Chief Complaint:  Chief Complaint  Patient presents with  . Follow-up    bipolar and OCD    HPI Trevor Pearson presents for follow-up of bipolar and OCD, and ADHD , dyslexia and ED.  Last seen August 2020.  No meds changed.   04/11/20 appt with the following noted: dont' believe in that vaccine. Been good.  Wife works at [employer].   Doesn't like too much Abilify bc it's too calming.  Normally happy and joyful and likes being that way.  Satisfied with meds.  No SE.   He feels he manages the hyperverbal pressured speech.  But acknowledges others still find him hyper  Patient reports stable mood and denies depressed or irritable moods.  Patient denies any recent difficulty with anxiety.  Patient denies difficulty with sleep initiation or maintenance. Denies appetite disturbance.  Patient reports that energy and motivation have been good.  Patient denies any difficulty with concentration.  Patient denies any suicidal ideation.  Past Psychiatric Medication Trials: history NR stimulants and wired up,    Abilify 30 and depakote 2000  Review of Systems:  Review of Systems  Gastrointestinal: Negative for nausea and vomiting.  Neurological: Negative for tremors and weakness.    Medications: I have reviewed the patient's current medications.  Current Outpatient Medications  Medication Sig Dispense Refill  . ARIPiprazole (ABILIFY) 30 MG tablet Take 1 tablet (30 mg total) by mouth daily. (Patient taking differently: Take 15 mg by mouth daily. ) 90 tablet 0  . atorvastatin (LIPITOR) 40 MG tablet Take 1 tablet (40 mg total) by mouth daily. 60 tablet 0  . divalproex (DEPAKOTE) 500 MG DR tablet Take 2 tablets (1,000 mg total) by mouth 2 (two) times daily. 360 tablet 1  . etanercept (ENBREL SURECLICK) 50 MG/ML injection Inject 50 mg into the skin once a week.        No current facility-administered medications for this visit.    Medication Side Effects: None  Allergies: No Known Allergies  Past Medical History:  Diagnosis Date  . ADHD (attention deficit hyperactivity disorder)   . Bipolar affective disorder ([place])   . Hyperlipidemia   . Psoriasis   . Psoriasis     Family History  Problem Relation Age of Onset  . Breast cancer Mother   . Diabetes Mother   . Hyperlipidemia Mother   . Hypertension Mother   . Lung cancer Father   . Obesity Sister   . Hyperlipidemia Brother   . Mental illness Son     Social History   Socioeconomic History  . Marital status: Married    Spouse name: Not on file  . Number of children: Not on file  . Years of education: Not on file  . Highest education level: Not on file  Occupational History  . Not on file  Tobacco Use  . Smoking status: Never Smoker  . Smokeless tobacco: Never Used  Substance and Sexual Activity  . Alcohol use: No  . Drug use: No  . Sexual activity: Yes    Birth control/protection: Surgical  Other Topics Concern  . Not on file  Social History Narrative  . Not on file   Social Determinants of Health   Financial Resource Strain:   . Difficulty of Paying Living Expenses:   Food Insecurity:   . Worried About Running  Out of Food in the Last Year:   . Ran Out of Food in the Last Year:   Transportation Needs:   . Lack of Transportation (Medical):   Marland Kitchen Lack of Transportation (Non-Medical):   Physical Activity:   . Days of Exercise per Week:   . Minutes of Exercise per Session:   Stress:   . Feeling of Stress :   Social Connections:   . Frequency of Communication with Friends and Family:   . Frequency of Social Gatherings with Friends and Family:   . Attends Religious Services:   . Active Member of Clubs or Organizations:   . Attends Banker Meetings:   Marland Kitchen Marital Status:   Intimate Partner Violence:   . Fear of Current or Ex-Partner:   . Emotionally  Abused:   Marland Kitchen Physically Abused:   . Sexually Abused:     Past Medical History, Surgical history, Social history, and Family history were reviewed and updated as appropriate.   Please see review of systems for further details on the patient's review from today.   Objective:   Physical Exam:  There were no vitals taken for this visit.  Physical Exam Constitutional:      General: He is not in acute distress. Musculoskeletal:        General: No deformity.  Neurological:     Mental Status: He is alert and oriented to person, place, and time.     Cranial Nerves: No dysarthria.     Coordination: Coordination normal.  Psychiatric:        Attention and Perception: Perception normal. He is inattentive. He does not perceive auditory or visual hallucinations.        Mood and Affect: Mood normal. Mood is not anxious or depressed. Affect is not labile, blunt, angry or inappropriate.        Speech: Speech normal.        Behavior: Behavior normal. Behavior is cooperative.        Thought Content: Thought content normal. Thought content is not paranoid or delusional. Thought content does not include homicidal or suicidal ideation. Thought content does not include homicidal or suicidal plan.        Cognition and Memory: Cognition and memory normal.        Judgment: Judgment normal.     Comments: Chronically hyperactive and hyperverbal.Stome tangentiality. Not carrying as much stuff as in the past.     Lab Review:     Component Value Date/Time   NA 141 08/04/2012 1452   K 4.1 08/04/2012 1452   CL 104 08/04/2012 1452   CO2 30 08/04/2012 1452   GLUCOSE 93 08/04/2012 1452   BUN 18 08/04/2012 1452   CREATININE 0.90 08/04/2012 1452   CALCIUM 9.1 08/04/2012 1452   PROT 6.6 08/04/2012 1452   ALBUMIN 4.2 08/04/2012 1452   AST 26 08/04/2012 1452   ALT 23 08/04/2012 1452   ALKPHOS 36 (L) 08/04/2012 1452   BILITOT 0.5 08/04/2012 1452   GFRNONAA >60 06/27/2011 1023   GFRAA >60 06/27/2011 1023        Component Value Date/Time   WBC 5.4 08/04/2012 1452   RBC 4.92 08/04/2012 1452   HGB 15.1 08/04/2012 1452   HCT 44.2 08/04/2012 1452   PLT 127 (L) 08/04/2012 1452   MCV 89.8 08/04/2012 1452   MCH 30.7 08/04/2012 1452   MCHC 34.2 08/04/2012 1452   RDW 14.3 08/04/2012 1452   LYMPHSABS 1.9 08/04/2012 1452   MONOABS 0.6 08/04/2012  1452   EOSABS 0.1 08/04/2012 1452   BASOSABS 0.0 08/04/2012 1452    No results found for: POCLITH, LITHIUM   Lab Results  Component Value Date   VALPROATE 139.1 (H) 08/04/2012     .res Assessment: Plan:    Porfirio was seen today for follow-up.  Diagnoses and all orders for this visit:  Bipolar I disorder, most recent episode hypomanic (HCC) -     divalproex (DEPAKOTE) 500 MG DR tablet; Take 2 tablets (1,000 mg total) by mouth 2 (two) times daily.  Hoarding disorder  Attention deficit hyperactivity disorder (ADHD), predominantly hyperactive type  Chronically hyperactive, hyperverbal, but not severely intense but manageable and without changing.  Can manage PT work.  Probably would be better socially if he was a little less hypomanic. No problems with meds.  Recommend increase Abilify to 20 mg for hypomania.  He agrees if the pt assistance can still be approved.    Discussed potential metabolic side effects associated with atypical antipsychotics, as well as potential risk for movement side effects. Advised pt to contact office if movement side effects occur.  No evidence for movement disorder.  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  Had labs at PCP and was told they were OK but will get results with ROI.  Not available via Epic.  Disc pricing of meds and GoodRX.  Asks about another therapist now that his has retired.  FU 6 mos  Meredith Staggers, MD, DFAPA     Please see After Visit Summary for patient specific instructions.  No future  appointments.  No orders of the defined types were placed in this encounter.     -------------------------------

## 2020-04-12 ENCOUNTER — Ambulatory Visit: Payer: Self-pay | Admitting: Psychiatry

## 2020-04-21 ENCOUNTER — Other Ambulatory Visit: Payer: Self-pay

## 2020-04-21 DIAGNOSIS — F310 Bipolar disorder, current episode hypomanic: Secondary | ICD-10-CM

## 2020-04-21 DIAGNOSIS — F423 Hoarding disorder: Secondary | ICD-10-CM

## 2020-04-21 MED ORDER — ARIPIPRAZOLE 20 MG PO TABS: 20.0000 mg | ORAL_TABLET | Freq: Every day | ORAL | 1 refills | Status: DC

## 2020-04-26 ENCOUNTER — Other Ambulatory Visit: Payer: Self-pay | Admitting: Psychiatry

## 2020-04-26 DIAGNOSIS — F310 Bipolar disorder, current episode hypomanic: Secondary | ICD-10-CM

## 2020-04-26 DIAGNOSIS — F423 Hoarding disorder: Secondary | ICD-10-CM

## 2020-04-26 MED ORDER — ARIPIPRAZOLE 20 MG PO TABS: 20.0000 mg | ORAL_TABLET | Freq: Every day | ORAL | 1 refills | Status: DC

## 2020-11-07 ENCOUNTER — Other Ambulatory Visit: Payer: Self-pay | Admitting: Psychiatry

## 2020-11-07 DIAGNOSIS — F423 Hoarding disorder: Secondary | ICD-10-CM

## 2020-11-07 DIAGNOSIS — F310 Bipolar disorder, current episode hypomanic: Secondary | ICD-10-CM

## 2020-11-08 ENCOUNTER — Other Ambulatory Visit: Payer: Self-pay | Admitting: Psychiatry

## 2020-11-08 DIAGNOSIS — F310 Bipolar disorder, current episode hypomanic: Secondary | ICD-10-CM

## 2021-03-07 ENCOUNTER — Other Ambulatory Visit: Payer: Self-pay | Admitting: Psychiatry

## 2021-03-07 ENCOUNTER — Telehealth: Payer: Self-pay | Admitting: Psychiatry

## 2021-03-07 NOTE — Telephone Encounter (Signed)
FYI, I wouldn't be able to do a tier reduction on that one anyway. [facility] is best bet. Thanks Sheralyn Boatman

## 2021-03-07 NOTE — Telephone Encounter (Signed)
Pt called and made an appt for June 14th. He needs a [facility].He said that he needs a tier exception for the abilify

## 2021-03-07 NOTE — Telephone Encounter (Signed)
Tell me have not been successful at asking for tier exceptions.  Ask him how much 90-day prescription for Abilify is costing him.  We can send it to [facility] and he can get 90 days of Abilify for $28 using [facility].

## 2021-03-07 NOTE — Telephone Encounter (Signed)
He said that it cost him between $45-$90.He has already received the Rx but in the future yes he would like to use [facility].

## 2021-03-07 NOTE — Telephone Encounter (Signed)
I can't find him on PMP for some reason.Epic shows 90 day rx was sent with 1 refill on 11/07/20

## 2021-03-08 NOTE — Telephone Encounter (Signed)
Received a surprising approval for him to get a lower copayment for his Aripiprazole 20 mg from his [facility], effective through 11/17/2021. Not sure exact cost comparable to [facility]

## 2021-03-08 NOTE — Telephone Encounter (Signed)
Amazing!  Thanks, Traci!  Sheralyn Boatman, let him know.

## 2021-03-09 NOTE — Telephone Encounter (Signed)
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

## 2021-03-09 NOTE — Telephone Encounter (Signed)
reviewed

## 2021-04-18 ENCOUNTER — Other Ambulatory Visit: Payer: Self-pay | Admitting: Psychiatry

## 2021-04-18 DIAGNOSIS — F423 Hoarding disorder: Secondary | ICD-10-CM

## 2021-04-18 DIAGNOSIS — F310 Bipolar disorder, current episode hypomanic: Secondary | ICD-10-CM

## 2021-05-02 ENCOUNTER — Ambulatory Visit (INDEPENDENT_AMBULATORY_CARE_PROVIDER_SITE_OTHER): Payer: Medicare (Managed Care) | Admitting: Psychiatry

## 2021-05-02 ENCOUNTER — Other Ambulatory Visit: Payer: Self-pay

## 2021-05-02 ENCOUNTER — Encounter: Payer: Self-pay | Admitting: Psychiatry

## 2021-05-02 DIAGNOSIS — F901 Attention-deficit hyperactivity disorder, predominantly hyperactive type: Secondary | ICD-10-CM

## 2021-05-02 DIAGNOSIS — F310 Bipolar disorder, current episode hypomanic: Secondary | ICD-10-CM | POA: Diagnosis not present

## 2021-05-02 DIAGNOSIS — F423 Hoarding disorder: Secondary | ICD-10-CM

## 2021-05-02 NOTE — Progress Notes (Signed)
Trevor Pearson [id number] [birth year] 60 y.o.   Subjective:   Patient ID:  Trevor Pearson is a 60 y.o. (DOB November 09, 1961) male.  Chief Complaint:  Chief Complaint  Patient presents with   Bipolar I disorder, most recent episode hypomanic (HCC)   Follow-up   Manic Behavior    HPI Trevor Pearson presents for follow-up of bipolar and OCD, and ADHD , dyslexia and ED.  seen August 2020.  No meds changed.   04/11/20 appt with the following noted: dont' believe in that vaccine. Been good.  Wife works at [employer].   Doesn't like too much Abilify bc it's too calming.  Normally happy and joyful and likes being that way.  Satisfied with meds.  No SE.   He feels he manages the hyperverbal pressured speech.  But acknowledges others still find him hyper Plan: Recommend increase Abilify to 20 mg for hypomania.   05/02/2021 appointment with the following noted: 4 grandkids from 1 yo to 85 yo.  Kids doing well.  2 in [place]. He didn't notice any difference with increase Abilify to 20 mg daily.  I take the med for other people bc med helps him be more relaxed around others and they notice.  No comments from family about it.  Wife now works from home and infringes on his space and has caused some conflict with them. More demands now that he has a home instead of renting and wife demanding of him.  Needs marital therapy but wife not willing to go. 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  Chronically hyper style.  Patient reports stable mood and denies depressed or irritable moods.  Patient denies any recent difficulty with anxiety.  Patient denies difficulty with sleep initiation or maintenance. Denies appetite disturbance.  Patient reports that energy and motivation have been good.  Patient denies any difficulty with concentration.  Patient denies any suicidal ideation.  Past Psychiatric Medication Trials: history NR stimulants and wired up,    Abilify 30 and depakote 2000  Review of Systems:   Review of Systems  Cardiovascular:  Negative for palpitations.  Gastrointestinal:  Negative for nausea and vomiting.  Neurological:  Negative for dizziness, tremors and weakness.   Medications: I have reviewed the patient's current medications.  Current Outpatient Medications  Medication Sig Dispense Refill   ARIPiprazole (ABILIFY) 20 MG tablet TAKE 1 TABLET DAILY 90 tablet 0   atorvastatin (LIPITOR) 40 MG tablet Take 1 tablet (40 mg total) by mouth daily. 60 tablet 0   diclofenac (VOLTAREN) 75 MG EC tablet Take 75 mg by mouth 2 (two) times daily.     divalproex (DEPAKOTE) 500 MG DR tablet TAKE 2 TABLETS TWICE A DAY 360 tablet 3   levothyroxine (SYNTHROID) 150 MCG tablet Take 75 mcg by mouth daily before breakfast.     Multiple Vitamin (MULTIVITAMIN) capsule Take 1 capsule by mouth daily.     tiZANidine (ZANAFLEX) 4 MG capsule Take 4 mg by mouth 3 (three) times daily. PRN     etanercept (ENBREL) 50 MG/ML injection Inject 50 mg into the skin once a week.   (Patient not taking: Reported on 05/02/2021)     No current facility-administered medications for this visit.    Medication Side Effects: None  Allergies: No Known Allergies  Past Medical History:  Diagnosis Date   ADHD (attention deficit hyperactivity disorder)    Bipolar affective disorder (HCC)    Hyperlipidemia    Psoriasis    Psoriasis  Family History  Problem Relation Age of Onset   Breast cancer Mother    Diabetes Mother    Hyperlipidemia Mother    Hypertension Mother    Lung cancer Father    Obesity Sister    Hyperlipidemia Brother    Mental illness Son     Social History   Socioeconomic History   Marital status: Married    Spouse name: Not on file   Number of children: Not on file   Years of education: Not on file   Highest education level: Not on file  Occupational History   Not on file  Tobacco Use   Smoking status: Never   Smokeless tobacco: Never  Substance and Sexual Activity   Alcohol  use: No   Drug use: No   Sexual activity: Yes    Birth control/protection: Surgical  Other Topics Concern   Not on file  Social History Narrative   Not on file   Social Determinants of Health   Financial Resource Strain: Not on file  Food Insecurity: Not on file  Transportation Needs: Not on file  Physical Activity: Not on file  Stress: Not on file  Social Connections: Not on file  Intimate Partner Violence: Not on file    Past Medical History, Surgical history, Social history, and Family history were reviewed and updated as appropriate.   Please see review of systems for further details on the patient's review from today.   Objective:   Physical Exam:  There were no vitals taken for this visit.  Physical Exam Constitutional:      General: He is not in acute distress. Musculoskeletal:        General: No deformity.  Neurological:     Mental Status: He is alert and oriented to person, place, and time.     Coordination: Coordination normal.  Psychiatric:        Attention and Perception: Perception normal. He is inattentive. He does not perceive auditory or visual hallucinations.        Mood and Affect: Mood normal. Mood is not anxious or depressed. Affect is not labile, blunt, angry or inappropriate.        Speech: Speech normal.        Behavior: Behavior normal. Behavior is cooperative.        Thought Content: Thought content normal. Thought content is not paranoid or delusional. Thought content does not include homicidal or suicidal ideation. Thought content does not include homicidal or suicidal plan.        Cognition and Memory: Cognition and memory normal.        Judgment: Judgment normal.     Comments: Chronically hyperactive and hyperverbal and overinclusive. Less tangentiality. Not carrying as much stuff as in the past.    Lab Review:     Component Value Date/Time   NA 141 08/04/2012 1452   K 4.1 08/04/2012 1452   CL 104 08/04/2012 1452   CO2 30 08/04/2012  1452   GLUCOSE 93 08/04/2012 1452   BUN 18 08/04/2012 1452   CREATININE 0.90 08/04/2012 1452   CALCIUM 9.1 08/04/2012 1452   PROT 6.6 08/04/2012 1452   ALBUMIN 4.2 08/04/2012 1452   AST 26 08/04/2012 1452   ALT 23 08/04/2012 1452   ALKPHOS 36 (L) 08/04/2012 1452   BILITOT 0.5 08/04/2012 1452   GFRNONAA >60 06/27/2011 1023   GFRAA >60 06/27/2011 1023       Component Value Date/Time   WBC 5.4 08/04/2012 1452  RBC 4.92 08/04/2012 1452   HGB 15.1 08/04/2012 1452   HCT 44.2 08/04/2012 1452   PLT 127 (L) 08/04/2012 1452   MCV 89.8 08/04/2012 1452   MCH 30.7 08/04/2012 1452   MCHC 34.2 08/04/2012 1452   RDW 14.3 08/04/2012 1452   LYMPHSABS 1.9 08/04/2012 1452   MONOABS 0.6 08/04/2012 1452   EOSABS 0.1 08/04/2012 1452   BASOSABS 0.0 08/04/2012 1452    No results found for: POCLITH, LITHIUM   Lab Results  Component Value Date   VALPROATE 139.1 (H) 08/04/2012     .res Assessment: Plan:    Trevor Pearson was seen today for bipolar i disorder, most recent episode hypomanic (hcc), follow-up and manic behavior.  Diagnoses and all orders for this visit:  Bipolar I disorder, most recent episode hypomanic (HCC)  Attention deficit hyperactivity disorder (ADHD), predominantly hyperactive type  Hoarding disorder  Chronically hyperactive, hyperverbal, but not severely intense but manageable and without much changing.  Can manage PT work.  No problems with meds.   Continue Abilify to 20 mg for hypomania.  .    Discussed potential metabolic side effects associated with atypical antipsychotics, as well as potential risk for movement side effects. Advised pt to contact office if movement side effects occur.  No evidence for movement disorder.  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  Had labs at PCP and was told they were OK but will get results with ROI.  Not available via Epic.  Disc pricing  of meds and [facility].  Asks about another therapist and would like for wife to come also. Wife so far not willing to go to counseling.  Issues with money and communication.  Will help him get a therapist.  FU 6 mos  Meredith Staggers, MD, DFAPA     Please see After Visit Summary for patient specific instructions.  No future appointments.  No orders of the defined types were placed in this encounter.     -------------------------------

## 2021-07-17 ENCOUNTER — Other Ambulatory Visit: Payer: Self-pay | Admitting: Psychiatry

## 2021-07-17 DIAGNOSIS — F310 Bipolar disorder, current episode hypomanic: Secondary | ICD-10-CM

## 2021-07-17 DIAGNOSIS — F423 Hoarding disorder: Secondary | ICD-10-CM

## 2021-10-18 ENCOUNTER — Ambulatory Visit (INDEPENDENT_AMBULATORY_CARE_PROVIDER_SITE_OTHER): Payer: Medicare (Managed Care) | Admitting: Psychiatry

## 2021-10-18 ENCOUNTER — Encounter: Payer: Self-pay | Admitting: Psychiatry

## 2021-10-18 ENCOUNTER — Other Ambulatory Visit: Payer: Self-pay

## 2021-10-18 DIAGNOSIS — F901 Attention-deficit hyperactivity disorder, predominantly hyperactive type: Secondary | ICD-10-CM

## 2021-10-18 DIAGNOSIS — F310 Bipolar disorder, current episode hypomanic: Secondary | ICD-10-CM | POA: Diagnosis not present

## 2021-10-18 DIAGNOSIS — F423 Hoarding disorder: Secondary | ICD-10-CM

## 2021-10-18 MED ORDER — ARIPIPRAZOLE 20 MG PO TABS: 20.0000 mg | ORAL_TABLET | Freq: Every day | ORAL | 1 refills | Status: DC

## 2021-10-18 MED ORDER — DIVALPROEX SODIUM 500 MG PO DR TAB: 1000.0000 mg | DELAYED_RELEASE_TABLET | Freq: Two times a day (BID) | ORAL | 3 refills | Status: DC

## 2021-10-18 NOTE — Progress Notes (Signed)
Trevor Pearson [id number] 01/15/1961 60 y.o.   Subjective:   Patient ID:  Trevor Pearson is a 60 y.o. (DOB August 02, 1961) male.  Chief Complaint:  Chief Complaint  Patient presents with   Follow-up    Bipolar I disorder, most recent episode hypomanic (HCC)    HPI Trevor Pearson presents for follow-up of bipolar and OCD, and ADHD , dyslexia and ED.  seen August 2020.  No meds changed.   04/11/20 appt with the following noted: dont' believe in that vaccine. Been good.  Wife works at [employer].   Doesn't like too much Abilify bc it's too calming.  Normally happy and joyful and likes being that way.  Satisfied with meds.  No SE.   He feels he manages the hyperverbal pressured speech.  But acknowledges others still find him hyper Plan: Recommend increase Abilify to 20 mg for hypomania.   05/02/2021 appointment with the following noted: 4 grandkids from 1 yo to 42 yo.  Kids doing well.  2 in [place]. He didn't notice any difference with increase Abilify to 20 mg daily.  I take the med for other people bc med helps him be more relaxed around others and they notice.  No comments from family about it.  Wife now works from home and infringes on his space and has caused some conflict with them. More demands now that he has a home instead of renting and wife demanding of him.  Needs marital therapy but wife not willing to go. 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   Plan no med changes.  10/18/2021 appointment with the following noted: Participates in [employer].  Active.  Bible studies. Hoarding managed.  Feels very blessed. No SE or problems with meds.   Some chronic communication issues with wife.  Married 51 yo. She has temper problems.  She doesn't want to do marital therapy.  She's critical of him. Satisfied with meds.  It helps keep him calm. Denies anger outbursts.   Patient reports stable mood and denies depressed or irritable moods.  Patient  denies any recent difficulty with anxiety.  Patient denies difficulty with sleep initiation or maintenance. Denies appetite disturbance.  Patient reports that energy and motivation have been good.  Patient denies any difficulty with concentration.  Patient denies any suicidal ideation.  Past Psychiatric Medication Trials: history NR stimulants and wired up,    Abilify 30 and depakote 2000  Review of Systems:  Review of Systems  Cardiovascular:  Negative for chest pain and palpitations.  Gastrointestinal:  Negative for nausea and vomiting.  Neurological:  Negative for dizziness, tremors and weakness.   Medications: I have reviewed the patient's current medications.  Current Outpatient Medications  Medication Sig Dispense Refill   atorvastatin (LIPITOR) 40 MG tablet Take 1 tablet (40 mg total) by mouth daily. 60 tablet 0   diclofenac (VOLTAREN) 75 MG EC tablet Take 75 mg by mouth 2 (two) times daily.     levothyroxine (SYNTHROID) 150 MCG tablet Take 75 mcg by mouth daily before breakfast.     Multiple Vitamin (MULTIVITAMIN) capsule Take 1 capsule by mouth daily.     tiZANidine (ZANAFLEX) 4 MG capsule Take 4 mg by mouth 3 (three) times daily. PRN     ARIPiprazole (ABILIFY) 20 MG tablet Take 1 tablet (20 mg total) by mouth daily. 90 tablet 1   divalproex (DEPAKOTE) 500 MG DR tablet Take 2 tablets (1,000 mg total) by mouth 2 (  two) times daily. 360 tablet 3   etanercept (ENBREL) 50 MG/ML injection Inject 50 mg into the skin once a week.   (Patient not taking: Reported on 05/02/2021)     No current facility-administered medications for this visit.    Medication Side Effects: None  Allergies: No Known Allergies  Past Medical History:  Diagnosis Date   ADHD (attention deficit hyperactivity disorder)    Bipolar affective disorder (HCC)    Hyperlipidemia    Psoriasis    Psoriasis     Family History  Problem Relation Age of Onset   Breast cancer Mother    Diabetes Mother     Hyperlipidemia Mother    Hypertension Mother    Lung cancer Father    Obesity Sister    Hyperlipidemia Brother    Mental illness Son     Social History   Socioeconomic History   Marital status: Married    Spouse name: Not on file   Number of children: Not on file   Years of education: Not on file   Highest education level: Not on file  Occupational History   Not on file  Tobacco Use   Smoking status: Never   Smokeless tobacco: Never  Substance and Sexual Activity   Alcohol use: No   Drug use: No   Sexual activity: Yes    Birth control/protection: Surgical  Other Topics Concern   Not on file  Social History Narrative   Not on file   Social Determinants of Health   Financial Resource Strain: Not on file  Food Insecurity: Not on file  Transportation Needs: Not on file  Physical Activity: Not on file  Stress: Not on file  Social Connections: Not on file  Intimate Partner Violence: Not on file    Past Medical History, Surgical history, Social history, and Family history were reviewed and updated as appropriate.   Please see review of systems for further details on the patient's review from today.   Objective:   Physical Exam:  There were no vitals taken for this visit.  Physical Exam Constitutional:      General: He is not in acute distress. Musculoskeletal:        General: No deformity.  Neurological:     Mental Status: He is alert and oriented to person, place, and time.     Coordination: Coordination normal.  Psychiatric:        Attention and Perception: Perception normal. He is inattentive. He does not perceive auditory or visual hallucinations.        Mood and Affect: Mood normal. Mood is not anxious or depressed. Affect is not labile, blunt, angry or inappropriate.        Speech: Speech normal.        Behavior: Behavior normal. Behavior is cooperative.        Thought Content: Thought content normal. Thought content is not paranoid or delusional.  Thought content does not include homicidal or suicidal ideation.        Cognition and Memory: Cognition and memory normal.        Judgment: Judgment normal.     Comments: Chronically hyperactive and hyperverbal and overinclusive unchanged. Not carrying as much stuff as in the past.    Lab Review:     Component Value Date/Time   NA 141 08/04/2012 1452   K 4.1 08/04/2012 1452   CL 104 08/04/2012 1452   CO2 30 08/04/2012 1452   GLUCOSE 93 08/04/2012 1452   BUN 18  08/04/2012 1452   CREATININE 0.90 08/04/2012 1452   CALCIUM 9.1 08/04/2012 1452   PROT 6.6 08/04/2012 1452   ALBUMIN 4.2 08/04/2012 1452   AST 26 08/04/2012 1452   ALT 23 08/04/2012 1452   ALKPHOS 36 (L) 08/04/2012 1452   BILITOT 0.5 08/04/2012 1452   GFRNONAA >60 06/27/2011 1023   GFRAA >60 06/27/2011 1023       Component Value Date/Time   WBC 5.4 08/04/2012 1452   RBC 4.92 08/04/2012 1452   HGB 15.1 08/04/2012 1452   HCT 44.2 08/04/2012 1452   PLT 127 (L) 08/04/2012 1452   MCV 89.8 08/04/2012 1452   MCH 30.7 08/04/2012 1452   MCHC 34.2 08/04/2012 1452   RDW 14.3 08/04/2012 1452   LYMPHSABS 1.9 08/04/2012 1452   MONOABS 0.6 08/04/2012 1452   EOSABS 0.1 08/04/2012 1452   BASOSABS 0.0 08/04/2012 1452    No results found for: POCLITH, LITHIUM   Lab Results  Component Value Date   VALPROATE 139.1 (H) 08/04/2012     .res Assessment: Plan:    Trevor Pearson was seen today for follow-up.  Diagnoses and all orders for this visit:  Bipolar I disorder, most recent episode hypomanic (HCC) -     ARIPiprazole (ABILIFY) 20 MG tablet; Take 1 tablet (20 mg total) by mouth daily. -     divalproex (DEPAKOTE) 500 MG DR tablet; Take 2 tablets (1,000 mg total) by mouth 2 (two) times daily.  Attention deficit hyperactivity disorder (ADHD), predominantly hyperactive type  Hoarding disorder -     ARIPiprazole (ABILIFY) 20 MG tablet; Take 1 tablet (20 mg total) by mouth daily.  Chronically hyperactive, hyperverbal, but not  severely intense.  Never able to completely manage hypomania but less manic with current meds.  Can manage PT work.  No problems with meds.   Continue Abilify to 20 mg for hypomania.   Continue Depakote. 1000 mg BID    Discussed potential metabolic side effects associated with atypical antipsychotics, as well as potential risk for movement side effects. Advised pt to contact office if movement side effects occur.  No evidence for movement disorder.  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  Had labs at PCP and was told they were OK but will get results with ROI.  Not available via Epic.  Disc pricing of meds and [facility].  Asks about another therapist and would like for wife to come also. Wife so far not willing to go to counseling.  Issues with money and communication.  Will help him get a therapist. Supportive therapy dealing with marital problems.  No med changes.  FU 6 mos  Meredith Staggers, MD, DFAPA     Please see After Visit Summary for patient specific instructions.  No future appointments.  No orders of the defined types were placed in this encounter.     -------------------------------

## 2022-04-15 ENCOUNTER — Telehealth: Payer: Self-pay

## 2022-04-15 NOTE — Telephone Encounter (Signed)
Prior authorization submitted and approved for ARIPIPRAZOLE 20 MG TABS with [employer] ID# [id number] effective 11/18/2021-11/17/2022

## 2022-05-23 ENCOUNTER — Encounter: Payer: Self-pay | Admitting: Psychiatry

## 2022-05-23 ENCOUNTER — Ambulatory Visit (INDEPENDENT_AMBULATORY_CARE_PROVIDER_SITE_OTHER): Payer: Medicare (Managed Care) | Admitting: Psychiatry

## 2022-05-23 DIAGNOSIS — F423 Hoarding disorder: Secondary | ICD-10-CM | POA: Diagnosis not present

## 2022-05-23 DIAGNOSIS — F901 Attention-deficit hyperactivity disorder, predominantly hyperactive type: Secondary | ICD-10-CM

## 2022-05-23 DIAGNOSIS — F310 Bipolar disorder, current episode hypomanic: Secondary | ICD-10-CM | POA: Diagnosis not present

## 2022-05-23 MED ORDER — ARIPIPRAZOLE 20 MG PO TABS: 20.0000 mg | ORAL_TABLET | Freq: Every day | ORAL | 1 refills | Status: DC

## 2022-05-23 NOTE — Progress Notes (Signed)
Trevor Pearson [id number] 12-Apr-1961 61 y.o.   Subjective:   Patient ID:  Trevor Pearson is a 61 y.o. (DOB 1961-06-23) male.  Chief Complaint:  Chief Complaint  Patient presents with   Follow-up   Manic Behavior   ADD    HPI Trevor Pearson presents for follow-up of bipolar and OCD, and ADHD , dyslexia and ED.  seen August 2020.  No meds changed.   04/11/20 appt with the following noted: dont' believe in that vaccine. Been good.  Wife works at [employer].   Doesn't like too much Abilify bc it's too calming.  Normally happy and joyful and likes being that way.  Satisfied with meds.  No SE.   He feels he manages the hyperverbal pressured speech.  But acknowledges others still find him hyper Plan: Recommend increase Abilify to 20 mg for hypomania.   05/02/2021 appointment with the following noted: 4 grandkids from 1 yo to 68 yo.  Kids doing well.  2 in [place]. He didn't notice any difference with increase Abilify to 20 mg daily.  I take the med for other people bc med helps him be more relaxed around others and they notice.  No comments from family about it.  Wife now works from home and infringes on his space and has caused some conflict with them. More demands now that he has a home instead of renting and wife demanding of him.  Needs marital therapy but wife not willing to go. 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   Plan no med changes.  10/18/2021 appointment with the following noted: Participates in [employer].  Active.  Bible studies. Hoarding managed.  Feels very blessed. No SE or problems with meds.   Some chronic communication issues with wife.  Married 14 yo. She has temper problems.  She doesn't want to do marital therapy.  She's critical of him. Satisfied with meds.  It helps keep him calm. Denies anger outbursts.  Plan: no med changes Continue Abilify to 20 mg for hypomania.   Continue Depakote. 1000 mg BID    05/23/2022  appointment with the following noted: Son graduated from [employer].   Celebrating 47 y anniversary. Still doing well all together.  Hanging in there and doing what he needs to do. Hoarding managed.   No SE or problems with meds.  Sister died of cancer who was supportive.  She helped him learn to read and write and led him to [facility]. Chronic marital stress bc wife's bitterness, anger, jealousy and not supportive of him in church.    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   Realizes he needs meds to be calmer bc family can't handle him hyper.  Frustration with wife not attentive to him or his needs. Consistent with meds.  No SE.  Does not want med changes.  Patient reports stable mood and denies depressed or irritable moods.  Patient denies any recent difficulty with anxiety.  Patient denies difficulty with sleep initiation or maintenance. Denies appetite disturbance.  Patient reports that energy and motivation have been good.  Patient denies any difficulty with concentration.  Patient denies any suicidal ideation.  Past Psychiatric Medication Trials: history NR stimulants and wired up,     Abilify 30 and depakote 2000  Review of Systems:  Review of Systems  Cardiovascular:  Negative for chest pain.  Gastrointestinal:  Negative for nausea and vomiting.  Neurological:  Negative for dizziness and tremors.    Medications: I have reviewed the patient's current medications.  Current Outpatient Medications  Medication Sig Dispense Refill   atorvastatin (LIPITOR) 40 MG tablet Take 1 tablet (40 mg total) by mouth daily. 60 tablet 0   diclofenac (VOLTAREN) 75 MG EC tablet Take 75 mg by mouth 2 (two) times daily.     divalproex (DEPAKOTE) 500 MG DR tablet Take 2 tablets (1,000 mg total) by mouth 2 (two) times daily. 360 tablet 3   levothyroxine (SYNTHROID) 150 MCG tablet Take 75 mcg by mouth daily before breakfast.     Multiple Vitamin (MULTIVITAMIN) capsule  Take 1 capsule by mouth daily.     tiZANidine (ZANAFLEX) 4 MG capsule Take 4 mg by mouth 3 (three) times daily. PRN     ARIPiprazole (ABILIFY) 20 MG tablet Take 1 tablet (20 mg total) by mouth daily. 90 tablet 1   etanercept (ENBREL) 50 MG/ML injection Inject 50 mg into the skin once a week.   (Patient not taking: Reported on 05/02/2021)     No current facility-administered medications for this visit.    Medication Side Effects: None  Allergies: No Known Allergies  Past Medical History:  Diagnosis Date   ADHD (attention deficit hyperactivity disorder)    Bipolar affective disorder (HCC)    Hyperlipidemia    Psoriasis    Psoriasis     Family History  Problem Relation Age of Onset   Breast cancer Mother    Diabetes Mother    Hyperlipidemia Mother    Hypertension Mother    Lung cancer Father    Obesity Sister    Hyperlipidemia Brother    Mental illness Son     Social History   Socioeconomic History   Marital status: Married    Spouse name: Not on file   Number of children: Not on file   Years of education: Not on file   Highest education level: Not on file  Occupational History   Not on file  Tobacco Use   Smoking status: Never   Smokeless tobacco: Never  Substance and Sexual Activity   Alcohol use: No   Drug use: No   Sexual activity: Yes    Birth control/protection: Surgical  Other Topics Concern   Not on file  Social History Narrative   Not on file   Social Determinants of Health   Financial Resource Strain: Not on file  Food Insecurity: Not on file  Transportation Needs: Not on file  Physical Activity: Not on file  Stress: Not on file  Social Connections: Not on file  Intimate Partner Violence: Not on file    Past Medical History, Surgical history, Social history, and Family history were reviewed and updated as appropriate.   Please see review of systems for further details on the patient's review from today.   Objective:   Physical Exam:   There were no vitals taken for this visit.  Physical Exam Constitutional:      General: He is not in acute distress. Musculoskeletal:        General: No deformity.  Neurological:     Mental Status: He is alert and oriented to person, place, and time.     Coordination: Coordination normal.  Psychiatric:        Attention and Perception: Perception normal. He is inattentive. He does not perceive auditory or visual hallucinations.        Mood and Affect: Mood normal. Mood is not anxious or depressed. Affect  is not labile, blunt, angry or inappropriate.        Speech: Speech normal.        Behavior: Behavior normal. Behavior is cooperative.        Thought Content: Thought content normal. Thought content is not paranoid or delusional. Thought content does not include homicidal or suicidal ideation. Thought content does not include suicidal plan.        Cognition and Memory: Cognition and memory normal.        Judgment: Judgment normal.     Comments: Chronically hyperactive and hyperverbal and overinclusive unchanged. Mild manic pressure chronically. Not carrying as much stuff as in the past but still wears cargo pants and pockets packed.     Lab Review:     Component Value Date/Time   NA 141 08/04/2012 1452   K 4.1 08/04/2012 1452   CL 104 08/04/2012 1452   CO2 30 08/04/2012 1452   GLUCOSE 93 08/04/2012 1452   BUN 18 08/04/2012 1452   CREATININE 0.90 08/04/2012 1452   CALCIUM 9.1 08/04/2012 1452   PROT 6.6 08/04/2012 1452   ALBUMIN 4.2 08/04/2012 1452   AST 26 08/04/2012 1452   ALT 23 08/04/2012 1452   ALKPHOS 36 (L) 08/04/2012 1452   BILITOT 0.5 08/04/2012 1452   GFRNONAA >60 06/27/2011 1023   GFRAA >60 06/27/2011 1023       Component Value Date/Time   WBC 5.4 08/04/2012 1452   RBC 4.92 08/04/2012 1452   HGB 15.1 08/04/2012 1452   HCT 44.2 08/04/2012 1452   PLT 127 (L) 08/04/2012 1452   MCV 89.8 08/04/2012 1452   MCH 30.7 08/04/2012 1452   MCHC 34.2 08/04/2012 1452    RDW 14.3 08/04/2012 1452   LYMPHSABS 1.9 08/04/2012 1452   MONOABS 0.6 08/04/2012 1452   EOSABS 0.1 08/04/2012 1452   BASOSABS 0.0 08/04/2012 1452    No results found for: "POCLITH", "LITHIUM"   Lab Results  Component Value Date   VALPROATE 139.1 (H) 08/04/2012    Component 03/25/22 05/04/21 08/11/20 01/28/20 05/17/19 01/27/18  Valproic Acid Lvl 61  91  63  56  119 High   123   03/25/22 CMP, CBC unremarkable.  .res Assessment: Plan:    [place] was seen today for follow-up, manic behavior and add.  Diagnoses and all orders for this visit:  Bipolar I disorder, most recent episode hypomanic (HCC) -     ARIPiprazole (ABILIFY) 20 MG tablet; Take 1 tablet (20 mg total) by mouth daily.  Attention deficit hyperactivity disorder (ADHD), predominantly hyperactive type  Hoarding disorder -     ARIPiprazole (ABILIFY) 20 MG tablet; Take 1 tablet (20 mg total) by mouth daily.   Chronically hyperactive, hyperverbal, but not severely intense.  Never able to completely manage hypomania but less manic with current meds.  Can manage PT work.  No problems with meds.   Does not want to increase meds. Continue Abilify to 20 mg for hypomania.   Continue Depakote. 1000 mg BID    Discussed potential metabolic side effects associated with atypical antipsychotics, as well as potential risk for movement side effects. Advised pt to contact office if movement side effects occur.  No evidence for movement disorder.  ED problems solved but frustrated with wife's lack of interest.  Remains disabled from FT work DT longterm instability, hyperactivity, flight of ideas, cognitive problems, inattention and social issues these sx cause.  Had labs at PCP and was told they were OK  03/2022 VPA 61.  Variability over time.  Disc pricing of meds and GoodRX.  Asks about another therapist and would like for wife to come also. Wife so far not willing to go to counseling.  Issues with money and communication.  Will  help him get a therapist. Supportive therapy dealing with marital problems.  No med changes.  FU 6 mos  Meredith Staggers, MD, DFAPA     Please see After Visit Summary for patient specific instructions.  No future appointments.   No orders of the defined types were placed in this encounter.     -------------------------------

## 2022-11-01 ENCOUNTER — Other Ambulatory Visit: Payer: Self-pay | Admitting: Psychiatry

## 2022-11-01 DIAGNOSIS — F310 Bipolar disorder, current episode hypomanic: Secondary | ICD-10-CM

## 2022-11-25 ENCOUNTER — Other Ambulatory Visit (HOSPITAL_COMMUNITY): Payer: Self-pay

## 2022-11-26 ENCOUNTER — Other Ambulatory Visit (HOSPITAL_COMMUNITY): Payer: Self-pay

## 2022-11-26 ENCOUNTER — Other Ambulatory Visit (HOSPITAL_BASED_OUTPATIENT_CLINIC_OR_DEPARTMENT_OTHER): Payer: Self-pay

## 2022-11-26 ENCOUNTER — Other Ambulatory Visit: Payer: Self-pay

## 2022-11-26 MED ORDER — LEVOTHYROXINE SODIUM 150 MCG PO TABS
75.0000 ug | ORAL_TABLET | Freq: Every day | ORAL | 5 refills | Status: AC
  Filled 2022-11-26: qty 30, 60d supply, fill #0
  Filled 2022-11-26: qty 15, 30d supply, fill #0
  Filled 2022-11-28: qty 30, 60d supply, fill #0

## 2022-11-26 MED ORDER — TIZANIDINE HCL 4 MG PO TABS
4.0000 mg | ORAL_TABLET | Freq: Three times a day (TID) | ORAL | 5 refills | Status: DC
  Filled 2022-11-26: qty 180, 60d supply, fill #0

## 2022-11-26 MED ORDER — DICLOFENAC SODIUM 75 MG PO TBEC
75.0000 mg | DELAYED_RELEASE_TABLET | Freq: Two times a day (BID) | ORAL | 3 refills | Status: DC
  Filled 2022-11-26: qty 180, 90d supply, fill #0

## 2022-11-26 MED ORDER — TIZANIDINE HCL 4 MG PO TABS
4.0000 mg | ORAL_TABLET | Freq: Three times a day (TID) | ORAL | 4 refills | Status: AC
  Filled 2022-11-26: qty 180, 60d supply, fill #0

## 2022-11-26 MED ORDER — DICLOFENAC SODIUM 75 MG PO TBEC
75.0000 mg | DELAYED_RELEASE_TABLET | Freq: Two times a day (BID) | ORAL | 2 refills | Status: AC
  Filled 2022-11-26: qty 180, 90d supply, fill #0

## 2022-11-26 MED ORDER — LEVOTHYROXINE SODIUM 150 MCG PO TABS
75.0000 ug | ORAL_TABLET | Freq: Every day | ORAL | 2 refills | Status: DC
  Filled 2022-11-26: qty 30, 60d supply, fill #0

## 2022-11-27 ENCOUNTER — Other Ambulatory Visit (HOSPITAL_COMMUNITY): Payer: Self-pay

## 2022-11-27 ENCOUNTER — Encounter (HOSPITAL_COMMUNITY): Payer: Self-pay

## 2022-11-27 ENCOUNTER — Other Ambulatory Visit: Payer: Self-pay

## 2022-11-28 ENCOUNTER — Other Ambulatory Visit (HOSPITAL_COMMUNITY): Payer: Self-pay

## 2022-11-28 ENCOUNTER — Other Ambulatory Visit: Payer: Self-pay

## 2022-11-28 ENCOUNTER — Other Ambulatory Visit: Payer: Self-pay | Admitting: Psychiatry

## 2022-11-28 DIAGNOSIS — F310 Bipolar disorder, current episode hypomanic: Secondary | ICD-10-CM

## 2022-11-28 DIAGNOSIS — F423 Hoarding disorder: Secondary | ICD-10-CM

## 2022-11-28 MED ORDER — DIVALPROEX SODIUM 500 MG PO DR TAB
1000.0000 mg | DELAYED_RELEASE_TABLET | Freq: Two times a day (BID) | ORAL | 0 refills | Status: DC
  Filled 2022-11-28: qty 360, 90d supply, fill #0

## 2022-11-28 MED ORDER — ARIPIPRAZOLE 20 MG PO TABS
20.0000 mg | ORAL_TABLET | Freq: Every day | ORAL | 1 refills | Status: DC
  Filled 2022-11-28: qty 90, 90d supply, fill #0

## 2022-11-29 ENCOUNTER — Other Ambulatory Visit (HOSPITAL_COMMUNITY): Payer: Self-pay

## 2022-11-29 ENCOUNTER — Encounter (HOSPITAL_COMMUNITY): Payer: Self-pay

## 2022-12-02 ENCOUNTER — Other Ambulatory Visit: Payer: Self-pay

## 2022-12-02 ENCOUNTER — Other Ambulatory Visit (HOSPITAL_COMMUNITY): Payer: Self-pay

## 2022-12-03 ENCOUNTER — Other Ambulatory Visit: Payer: Self-pay

## 2022-12-11 ENCOUNTER — Ambulatory Visit (INDEPENDENT_AMBULATORY_CARE_PROVIDER_SITE_OTHER): Payer: PPO | Admitting: Psychiatry

## 2022-12-11 ENCOUNTER — Encounter: Payer: Self-pay | Admitting: Psychiatry

## 2022-12-11 DIAGNOSIS — R480 Dyslexia and alexia: Secondary | ICD-10-CM

## 2022-12-11 DIAGNOSIS — F423 Hoarding disorder: Secondary | ICD-10-CM

## 2022-12-11 DIAGNOSIS — F901 Attention-deficit hyperactivity disorder, predominantly hyperactive type: Secondary | ICD-10-CM

## 2022-12-11 DIAGNOSIS — F310 Bipolar disorder, current episode hypomanic: Secondary | ICD-10-CM

## 2022-12-11 MED ORDER — DIVALPROEX SODIUM 500 MG PO DR TAB: 1000.0000 mg | DELAYED_RELEASE_TABLET | Freq: Two times a day (BID) | ORAL | 1 refills | Status: DC

## 2022-12-11 NOTE — Progress Notes (Signed)
Basilio MAHLER [id number] 1961-04-26 62 y.o.   Subjective:   Patient ID:  Trevor Pearson is a 62 y.o. (DOB Feb 13, 1961) male.  Chief Complaint:  Chief Complaint  Patient presents with   Follow-up    Bipolar I disorder, most recent episode hypomanic [facility])   ADHD    comp   Adrian Saran presents for follow-up of bipolar and OCD, and ADHD , dyslexia and ED.  seen August 2020.  No meds changed.   04/11/20 appt with the following noted: dont' believe in that vaccine. Been good.  Wife works at [employer].   Doesn't like too much Abilify bc it's too calming.  Normally happy and joyful and likes being that way.  Satisfied with meds.  No SE.   He feels he manages the hyperverbal pressured speech.  But acknowledges others still find him hyper Plan: Recommend increase Abilify to 20 mg for hypomania.   05/02/2021 appointment with the following noted: 4 grandkids from 1 yo to 59 yo.  Kids doing well.  2 in [place]. He didn't notice any difference with increase Abilify to 20 mg daily.  I take the med for other people bc med helps him be more relaxed around others and they notice.  No comments from family about it.  Wife now works from home and infringes on his space and has caused some conflict with them. More demands now that he has a home instead of renting and wife demanding of him.  Needs marital therapy but wife not willing to go. 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   Plan no med changes.  10/18/2021 appointment with the following noted: Participates in [employer].  Active.  Bible studies. Hoarding managed.  Feels very blessed. No SE or problems with meds.   Some chronic communication issues with wife.  Married 20 yo. She has temper problems.  She doesn't want to do marital therapy.  She's critical of him. Satisfied with meds.  It helps keep him calm. Denies anger outbursts.  Plan: no med changes Continue Abilify to 20 mg for hypomania.    Continue Depakote. 1000 mg BID    05/23/2022 appointment with the following noted: Son graduated from [employer].   Celebrating 38 y anniversary. Still doing well all together.  Hanging in there and doing what he needs to do. Hoarding managed.   No SE or problems with meds.  Sister died of cancer who was supportive.  She helped him learn to read and write and led him to [facility]. Chronic marital stress bc wife's bitterness, anger, jealousy and not supportive of him in church.    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   Realizes he needs meds to be calmer bc family can't handle him hyper.  Frustration with wife not attentive to him or his needs. Consistent with meds.  No SE.  Does not want med changes.  12/11/22 appt noted: Compliant with meds. No SE.   2 years CPAP and sleep 10 hours.  Before CPAP was sleeping a lot.   Faith is strong.   Has some notes to help with being succinct.  Overall is bettter getting places on time.   Knows he is a bit hyper chronically.  Complains others are too busy to give him time. Dyslexia interferes with him reading so uses audio books and dictation.  Frustrate his goals. Has DM.  Patient reports stable mood and  denies depressed or irritable moods.  Patient denies any recent difficulty with anxiety.  Patient denies difficulty with sleep initiation or maintenance. Denies appetite disturbance.  Patient reports that energy and motivation have been good.  Patient denies any difficulty with concentration.  Patient denies any suicidal ideation.  Past Psychiatric Medication Trials: history NR stimulants and wired up,     Abilify 30 and depakote 2000  Came from [place] 1971  Review of Systems:  Review of Systems  Cardiovascular:  Negative for chest pain.  Gastrointestinal:  Negative for nausea.  Neurological:  Negative for dizziness and tremors.    Medications: I have reviewed the patient's current medications.  Current  Outpatient Medications  Medication Sig Dispense Refill   ARIPiprazole (ABILIFY) 20 MG tablet Take 1 tablet (20 mg total) by mouth daily. 90 tablet 1   atorvastatin (LIPITOR) 40 MG tablet Take 1 tablet (40 mg total) by mouth daily. 60 tablet 0   diclofenac (VOLTAREN) 75 MG EC tablet Take 75 mg by mouth 2 (two) times daily.     diclofenac (VOLTAREN) 75 MG EC tablet Take 1 tablet (75 mg total) by mouth 2 (two) times daily. 180 tablet 2   levothyroxine (SYNTHROID) 150 MCG tablet Take 75 mcg by mouth daily before breakfast.     levothyroxine (SYNTHROID) 150 MCG tablet Take 0.5 tablets (75 mcg total) by mouth daily. 30 tablet 5   Multiple Vitamin (MULTIVITAMIN) capsule Take 1 capsule by mouth daily.     tiZANidine (ZANAFLEX) 4 MG capsule Take 4 mg by mouth 3 (three) times daily. PRN     tiZANidine (ZANAFLEX) 4 MG tablet Take 1 tablet (4 mg total) by mouth every 8 (eight) hours. 180 tablet 4   divalproex (DEPAKOTE) 500 MG DR tablet Take 2 tablets (1,000 mg total) by mouth 2 (two) times daily. 360 tablet 1   No current facility-administered medications for this visit.    Medication Side Effects: None  Allergies: No Known Allergies  Past Medical History:  Diagnosis Date   ADHD (attention deficit hyperactivity disorder)    Bipolar affective disorder ([place])    Hyperlipidemia    Psoriasis    Psoriasis     Family History  Problem Relation Age of Onset   Breast cancer Mother    Diabetes Mother    Hyperlipidemia Mother    Hypertension Mother    Lung cancer Father    Obesity Sister    Hyperlipidemia Brother    Mental illness Son     Social History   Socioeconomic History   Marital status: Married    Spouse name: Not on file   Number of children: Not on file   Years of education: Not on file   Highest education level: Not on file  Occupational History   Not on file  Tobacco Use   Smoking status: Never   Smokeless tobacco: Never  Substance and Sexual Activity   Alcohol use: No    Drug use: No   Sexual activity: Yes    Birth control/protection: Surgical  Other Topics Concern   Not on file  Social History Narrative   Not on file   Social Determinants of Health   Financial Resource Strain: Not on file  Food Insecurity: Not on file  Transportation Needs: Not on file  Physical Activity: Not on file  Stress: Not on file  Social Connections: Not on file  Intimate Partner Violence: Not on file    Past Medical History, Surgical history, Social history, and Family  history were reviewed and updated as appropriate.   Please see review of systems for further details on the patient's review from today.   Objective:   Physical Exam:  There were no vitals taken for this visit.  Physical Exam Constitutional:      General: He is not in acute distress. Musculoskeletal:        General: No deformity.  Neurological:     Mental Status: He is alert and oriented to person, place, and time.     Coordination: Coordination normal.  Psychiatric:        Attention and Perception: Perception normal. He is inattentive. He does not perceive auditory or visual hallucinations.        Mood and Affect: Mood normal. Mood is not anxious or depressed. Affect is not labile, blunt, angry or inappropriate.        Speech: Speech normal.        Behavior: Behavior normal. Behavior is cooperative.        Thought Content: Thought content normal. Thought content is not paranoid or delusional. Thought content does not include homicidal or suicidal ideation. Thought content does not include suicidal plan.        Cognition and Memory: Cognition and memory normal.        Judgment: Judgment normal.     Comments: Chronically hyperactive and hyperverbal and overinclusive unchanged. Mild manic pressure chronically and maybe a bit worse last time. Not carrying as much stuff as in the past but still wears cargo pants and pockets packed. Knows he is a bit hyper chronically.     Lab Review:      Component Value Date/Time   NA 141 08/04/2012 1452   K 4.1 08/04/2012 1452   CL 104 08/04/2012 1452   CO2 30 08/04/2012 1452   GLUCOSE 93 08/04/2012 1452   BUN 18 08/04/2012 1452   CREATININE 0.90 08/04/2012 1452   CALCIUM 9.1 08/04/2012 1452   PROT 6.6 08/04/2012 1452   ALBUMIN 4.2 08/04/2012 1452   AST 26 08/04/2012 1452   ALT 23 08/04/2012 1452   ALKPHOS 36 (L) 08/04/2012 1452   BILITOT 0.5 08/04/2012 1452   GFRNONAA >60 06/27/2011 1023   GFRAA >60 06/27/2011 1023       Component Value Date/Time   WBC 5.4 08/04/2012 1452   RBC 4.92 08/04/2012 1452   HGB 15.1 08/04/2012 1452   HCT 44.2 08/04/2012 1452   PLT 127 (L) 08/04/2012 1452   MCV 89.8 08/04/2012 1452   MCH 30.7 08/04/2012 1452   MCHC 34.2 08/04/2012 1452   RDW 14.3 08/04/2012 1452   LYMPHSABS 1.9 08/04/2012 1452   MONOABS 0.6 08/04/2012 1452   EOSABS 0.1 08/04/2012 1452   BASOSABS 0.0 08/04/2012 1452    No results found for: "POCLITH", "LITHIUM"   Lab Results  Component Value Date   VALPROATE 139.1 (H) 08/04/2012    Component 03/25/22 05/04/21 08/11/20 01/28/20 05/17/19 01/27/18  Valproic Acid Lvl 61  91  63  56  119 High   123   03/25/22 CMP, CBC unremarkable.  .res Assessment: Plan:    [place] was seen today for follow-up and adhd.  Diagnoses and all orders for this visit:  Bipolar I disorder, most recent episode hypomanic (HCC) -     divalproex (DEPAKOTE) 500 MG DR tablet; Take 2 tablets (1,000 mg total) by mouth 2 (two) times daily.  Attention deficit hyperactivity disorder (ADHD), predominantly hyperactive type  Hoarding disorder  Dyslexia   Chronically hyperactive, hyperverbal,  but not severely intense.  Never able to completely manage hypomania but less manic with current meds.  Can manage PT work.  No problems with meds.   Does not want to increase meds.  Might do better with more mood stabilizer. Continue Abilify to 20 mg for hypomania.   Continue Depakote. 1000 mg BID    Discussed  potential metabolic side effects associated with atypical antipsychotics, as well as potential risk for movement side effects. Advised pt to contact office if movement side effects occur.  No evidence for movement disorder.  ED problems solved but frustrated with wife's lack of interest.  Remains disabled from [employer] work DT longterm instability, hyperactivity, flight of ideas, cognitive problems, inattention and social issues these sx cause. Disc ways to ineract with world given he is chronically hyperactive and pressured.  Disc possible getting more counseling.    Had labs at PCP and was told they were OK  03/2022 VPA 61.  Variability over time.  Disc pricing of meds and GoodRX.  Asks about another therapist and would like for wife to come also. Wife so far not willing to go to counseling.  Issues with money and communication.  Will help him get a therapist. Supportive therapy dealing with marital problems.  No med changes.  He doesn't want to increase though remains hyper.  FU 6 mos  Lynder Parents, MD, DFAPA     Please see After Visit Summary for patient specific instructions.  No future appointments.   No orders of the defined types were placed in this encounter.     -------------------------------

## 2023-01-30 ENCOUNTER — Other Ambulatory Visit: Payer: Self-pay | Admitting: Psychiatry

## 2023-01-30 DIAGNOSIS — F310 Bipolar disorder, current episode hypomanic: Secondary | ICD-10-CM

## 2023-01-30 DIAGNOSIS — F423 Hoarding disorder: Secondary | ICD-10-CM

## 2023-03-25 ENCOUNTER — Other Ambulatory Visit: Payer: Self-pay

## 2023-03-26 ENCOUNTER — Other Ambulatory Visit: Payer: Self-pay

## 2023-03-26 DIAGNOSIS — F423 Hoarding disorder: Secondary | ICD-10-CM

## 2023-03-26 DIAGNOSIS — F310 Bipolar disorder, current episode hypomanic: Secondary | ICD-10-CM

## 2023-03-26 MED ORDER — ARIPIPRAZOLE 20 MG PO TABS: 20.0000 mg | ORAL_TABLET | Freq: Every day | ORAL | 0 refills | Status: DC

## 2023-05-19 ENCOUNTER — Other Ambulatory Visit: Payer: Self-pay | Admitting: Psychiatry

## 2023-05-19 DIAGNOSIS — F310 Bipolar disorder, current episode hypomanic: Secondary | ICD-10-CM

## 2023-05-19 DIAGNOSIS — F423 Hoarding disorder: Secondary | ICD-10-CM

## 2023-05-21 ENCOUNTER — Ambulatory Visit: Payer: PPO | Admitting: Psychiatry

## 2023-05-27 ENCOUNTER — Ambulatory Visit: Payer: PPO | Admitting: Psychiatry

## 2023-06-30 ENCOUNTER — Other Ambulatory Visit: Payer: Self-pay | Admitting: Psychiatry

## 2023-06-30 DIAGNOSIS — F310 Bipolar disorder, current episode hypomanic: Secondary | ICD-10-CM

## 2023-07-15 ENCOUNTER — Encounter: Payer: Self-pay | Admitting: Psychiatry

## 2023-07-15 ENCOUNTER — Ambulatory Visit (INDEPENDENT_AMBULATORY_CARE_PROVIDER_SITE_OTHER): Payer: PPO | Admitting: Psychiatry

## 2023-07-15 DIAGNOSIS — R480 Dyslexia and alexia: Secondary | ICD-10-CM | POA: Diagnosis not present

## 2023-07-15 DIAGNOSIS — F310 Bipolar disorder, current episode hypomanic: Secondary | ICD-10-CM | POA: Diagnosis not present

## 2023-07-15 DIAGNOSIS — F423 Hoarding disorder: Secondary | ICD-10-CM

## 2023-07-15 DIAGNOSIS — F901 Attention-deficit hyperactivity disorder, predominantly hyperactive type: Secondary | ICD-10-CM | POA: Diagnosis not present

## 2023-07-15 MED ORDER — ARIPIPRAZOLE 20 MG PO TABS
20.0000 mg | ORAL_TABLET | Freq: Every day | ORAL | 2 refills | Status: DC
Start: 2023-07-15 — End: 2024-07-14

## 2023-07-15 MED ORDER — DIVALPROEX SODIUM 500 MG PO DR TAB: 1000.0000 mg | DELAYED_RELEASE_TABLET | Freq: Two times a day (BID) | ORAL | 2 refills | Status: DC
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
## 2024-07-08 ENCOUNTER — Other Ambulatory Visit: Payer: Self-pay | Admitting: Psychiatry

## 2024-07-08 DIAGNOSIS — F310 Bipolar disorder, current episode hypomanic: Secondary | ICD-10-CM

## 2024-07-08 DIAGNOSIS — F423 Hoarding disorder: Secondary | ICD-10-CM

## 2024-07-09 ENCOUNTER — Other Ambulatory Visit (HOSPITAL_COMMUNITY): Payer: Self-pay

## 2024-07-09 ENCOUNTER — Other Ambulatory Visit: Payer: Self-pay | Admitting: Psychiatry

## 2024-07-09 DIAGNOSIS — F310 Bipolar disorder, current episode hypomanic: Secondary | ICD-10-CM

## 2024-07-09 MED ORDER — DIVALPROEX SODIUM 500 MG PO DR TAB
1000.0000 mg | DELAYED_RELEASE_TABLET | Freq: Two times a day (BID) | ORAL | 0 refills | Status: DC
  Filled 2024-07-09: qty 360, 90d supply, fill #0

## 2024-07-12 ENCOUNTER — Other Ambulatory Visit: Payer: Self-pay

## 2024-07-13 ENCOUNTER — Other Ambulatory Visit: Payer: Self-pay

## 2024-07-14 ENCOUNTER — Ambulatory Visit (INDEPENDENT_AMBULATORY_CARE_PROVIDER_SITE_OTHER): Payer: PPO | Admitting: Psychiatry

## 2024-07-14 ENCOUNTER — Other Ambulatory Visit: Payer: Self-pay

## 2024-07-14 ENCOUNTER — Encounter: Payer: Self-pay | Admitting: Psychiatry

## 2024-07-14 ENCOUNTER — Other Ambulatory Visit (HOSPITAL_COMMUNITY): Payer: Self-pay

## 2024-07-14 DIAGNOSIS — R480 Dyslexia and alexia: Secondary | ICD-10-CM

## 2024-07-14 DIAGNOSIS — F901 Attention-deficit hyperactivity disorder, predominantly hyperactive type: Secondary | ICD-10-CM

## 2024-07-14 DIAGNOSIS — F423 Hoarding disorder: Secondary | ICD-10-CM

## 2024-07-14 DIAGNOSIS — F310 Bipolar disorder, current episode hypomanic: Secondary | ICD-10-CM

## 2024-07-14 MED ORDER — ARIPIPRAZOLE 20 MG PO TABS
20.0000 mg | ORAL_TABLET | Freq: Every day | ORAL | 3 refills | Status: DC
  Filled 2024-07-14: qty 90, 90d supply, fill #0

## 2024-07-14 MED ORDER — DIVALPROEX SODIUM 500 MG PO DR TAB: 1000.0000 mg | DELAYED_RELEASE_TABLET | Freq: Two times a day (BID) | ORAL | 3 refills | Status: DC
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
## 2024-07-15 ENCOUNTER — Other Ambulatory Visit: Payer: Self-pay

## 2024-07-15 ENCOUNTER — Ambulatory Visit: Payer: PPO | Admitting: Psychiatry

## 2024-07-21 ENCOUNTER — Other Ambulatory Visit: Payer: Self-pay

## 2024-07-24 ENCOUNTER — Other Ambulatory Visit: Payer: Self-pay | Admitting: Psychiatry

## 2024-07-24 DIAGNOSIS — F310 Bipolar disorder, current episode hypomanic: Secondary | ICD-10-CM

## 2024-07-24 DIAGNOSIS — F423 Hoarding disorder: Secondary | ICD-10-CM

## 2025-07-18 ENCOUNTER — Ambulatory Visit (INDEPENDENT_AMBULATORY_CARE_PROVIDER_SITE_OTHER): Admitting: Psychiatry
